# Patient Record
Sex: Female | Born: 1960 | Race: White | Hispanic: No | Marital: Married
Health system: Southern US, Community
[De-identification: ages and names within clinical notes are randomized; demographics above are authoritative.]

---

## 2020-11-27 ENCOUNTER — Encounter (HOSPITAL_COMMUNITY): Payer: Self-pay | Admitting: Emergency Medicine

## 2020-11-27 ENCOUNTER — Emergency Department (HOSPITAL_COMMUNITY): Payer: Medicaid - Out of State

## 2020-11-27 ENCOUNTER — Emergency Department (HOSPITAL_COMMUNITY)
Admission: EM | Admit: 2020-11-27 | Discharge: 2020-11-28 | Discharge status: Against Medical Advice | Payer: Medicaid - Out of State | Attending: Emergency Medicine | Admitting: Emergency Medicine

## 2020-11-27 DIAGNOSIS — H4710 Unspecified papilledema: Secondary | ICD-10-CM

## 2020-11-27 DIAGNOSIS — H539 Unspecified visual disturbance: Secondary | ICD-10-CM

## 2020-11-27 DIAGNOSIS — R519 Headache, unspecified: Secondary | ICD-10-CM | POA: Insufficient documentation

## 2020-11-27 DIAGNOSIS — H538 Other visual disturbances: Secondary | ICD-10-CM | POA: Diagnosis present

## 2020-11-27 LAB — CBC WITH DIFFERENTIAL/PLATELET
Abs Immature Granulocytes: 0.05 10*3/uL (ref 0.00–0.07)
Basophils Absolute: 0.1 10*3/uL (ref 0.0–0.1)
Basophils Relative: 1 %
Eosinophils Absolute: 0.2 10*3/uL (ref 0.0–0.5)
Eosinophils Relative: 2 %
HCT: 41.3 % (ref 36.0–46.0)
Hemoglobin: 13.1 g/dL (ref 12.0–15.0)
Immature Granulocytes: 1 %
Lymphocytes Relative: 25 %
Lymphs Abs: 2.7 10*3/uL (ref 0.7–4.0)
MCH: 28.3 pg (ref 26.0–34.0)
MCHC: 31.7 g/dL (ref 30.0–36.0)
MCV: 89.2 fL (ref 80.0–100.0)
Monocytes Absolute: 0.8 10*3/uL (ref 0.1–1.0)
Monocytes Relative: 7 %
Neutro Abs: 7.2 10*3/uL (ref 1.7–7.7)
Neutrophils Relative %: 64 %
Platelets: 334 10*3/uL (ref 150–400)
RBC: 4.63 MIL/uL (ref 3.87–5.11)
RDW: 14.7 % (ref 11.5–15.5)
WBC: 11 10*3/uL — ABNORMAL HIGH (ref 4.0–10.5)
nRBC: 0 % (ref 0.0–0.2)

## 2020-11-27 LAB — COMPREHENSIVE METABOLIC PANEL
ALT: 30 U/L (ref 0–44)
AST: 33 U/L (ref 15–41)
Albumin: 3.7 g/dL (ref 3.5–5.0)
Alkaline Phosphatase: 100 U/L (ref 38–126)
Anion gap: 7 (ref 5–15)
BUN: 9 mg/dL (ref 6–20)
CO2: 27 mmol/L (ref 22–32)
Calcium: 9 mg/dL (ref 8.9–10.3)
Chloride: 105 mmol/L (ref 98–111)
Creatinine, Ser: 0.68 mg/dL (ref 0.44–1.00)
GFR, Estimated: >60 mL/min (ref >60)
Glucose, Bld: 122 mg/dL — ABNORMAL HIGH (ref 70–99)
Potassium: 3.9 mmol/L (ref 3.5–5.1)
Sodium: 139 mmol/L (ref 135–145)
Total Bilirubin: 0.4 mg/dL (ref 0.3–1.2)
Total Protein: 6.5 g/dL (ref 6.5–8.1)

## 2020-11-27 IMAGING — MR MR HEAD WO/W CM
7 of 13 series · 23 of 48 positions shown · IV contrast (gadavist)
Comparison: No prior MRI, correlation is needed with CT head
[DATE].

CLINICAL DATA: Binocular vision loss.

EXAM:
MRI HEAD AND ORBITS WITHOUT AND WITH CONTRAST
TECHNIQUE: Multiplanar, multiecho pulse sequences of the brain and surrounding
structures were obtained without and with intravenous contrast.
Multiplanar, multiecho pulse sequences of the orbits and surrounding
structures were obtained including fat saturation techniques, before
and after intravenous contrast administration.
CONTRAST:  9.5mL GADAVIST GADOBUTROL 1 MMOL/ML IV SOLN

[Series 2: DWI · axial · 3.0mm · 0.94mm/px · z∈[-87,+69]mm · 8 of 108 slices shown (1 of 2)]
[im 1/108]
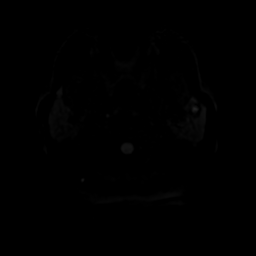
[im 16/108]
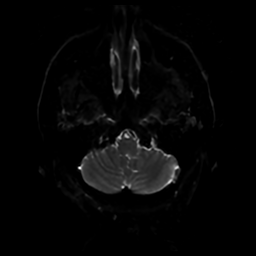
[im 31/108]
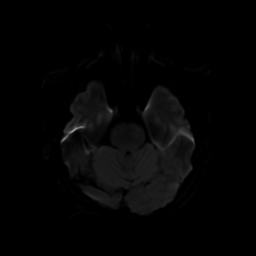
[im 46/108]
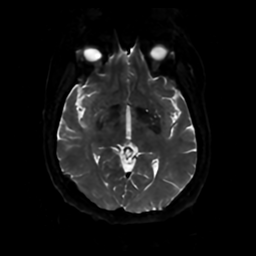
[im 62/108]
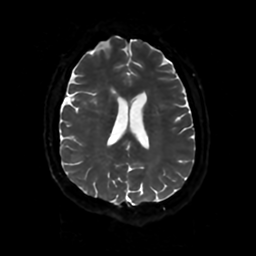
[im 77/108]
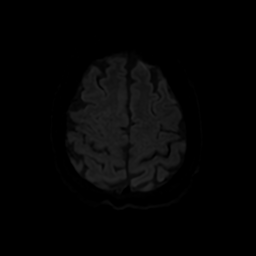
[im 92/108]
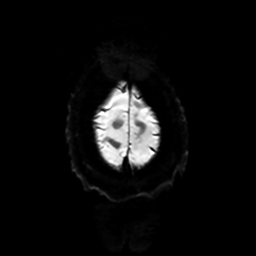
[im 108/108]
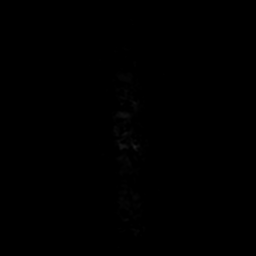

[Series 3: DWI · coronal · 4.0mm · 0.94mm/px · 5 of 74 slices shown (2 of 2)]
[im 1/74]
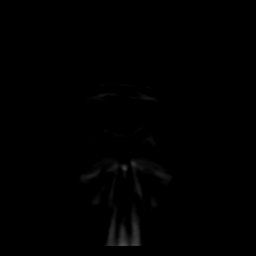
[im 19/74]
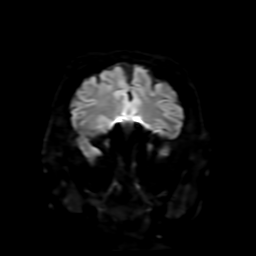
[im 37/74]
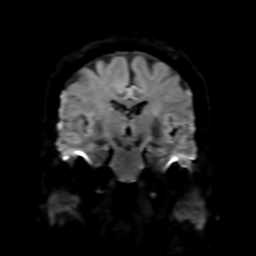
[im 55/74]
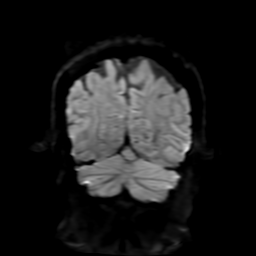
[im 74/74]
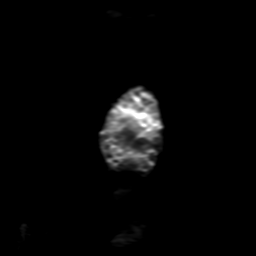

[Series 4: FLAIR · sagittal · 5.0mm · 0.23mm/px · 2 of 28 slices shown (1 of 2)]
[im 1/28]
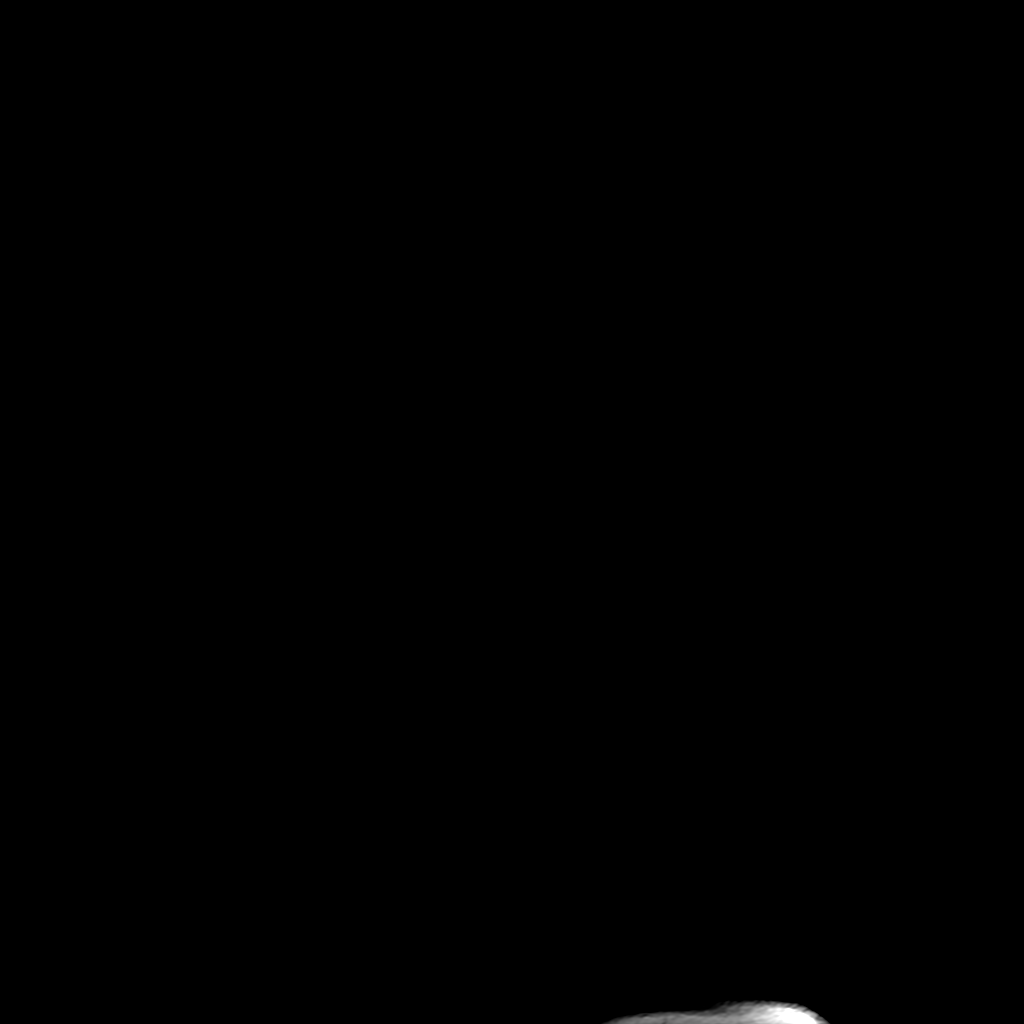
[im 28/28]
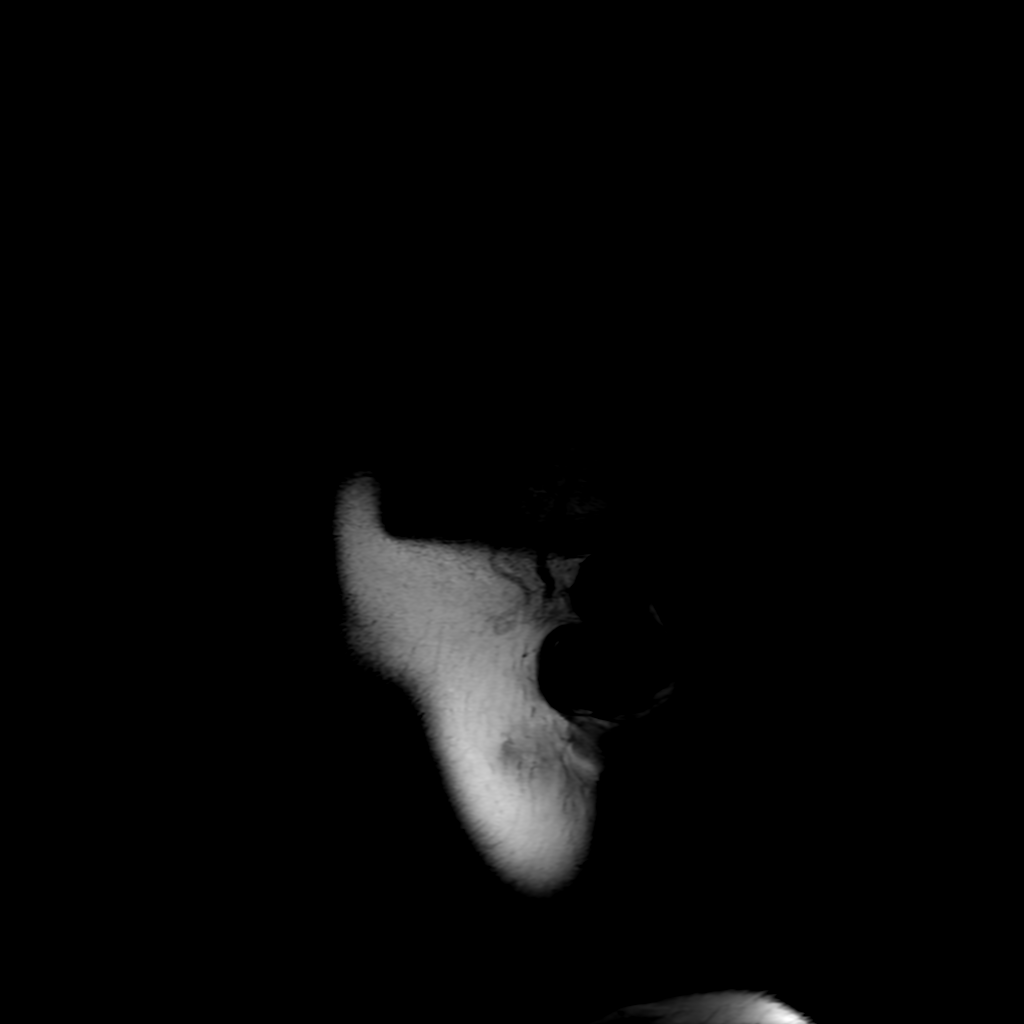

[Series 5: T2 · axial · 5.0mm · 0.23mm/px · 1 of 27 slices shown]
[im 1/27]
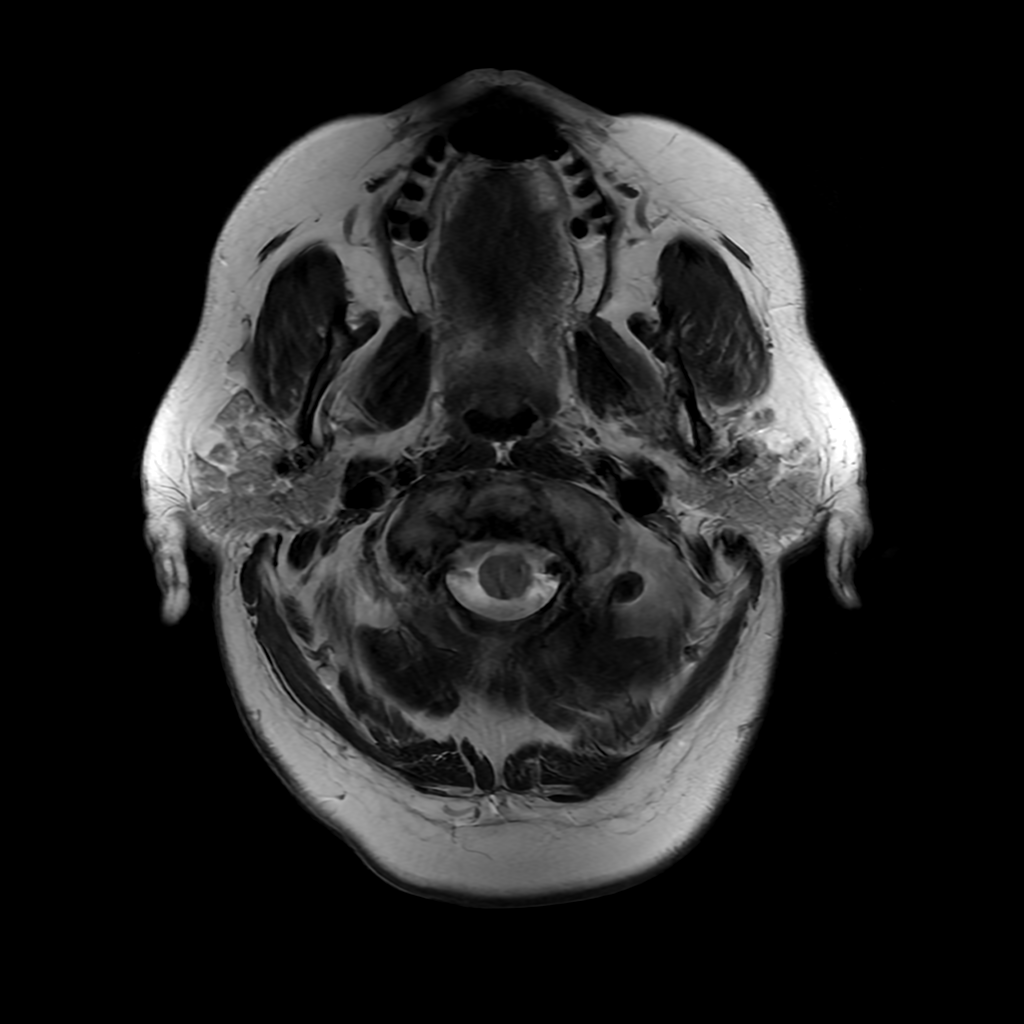

[Series 6: FLAIR · axial · 4.0mm · 0.45mm/px · z∈[-85,+65]mm · 2 of 36 slices shown (2 of 2)]
[im 1/36]
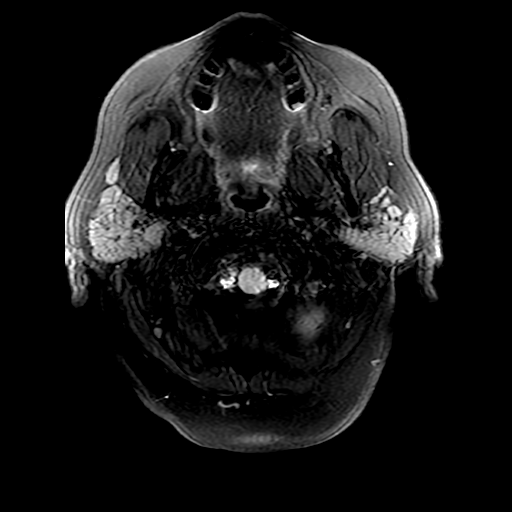
[im 36/36]
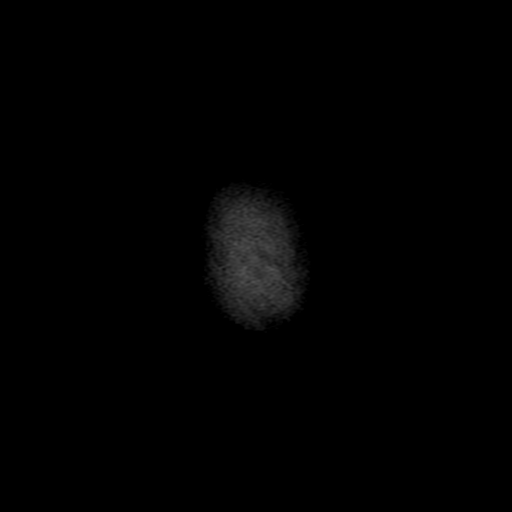

[Series 250: ADC · axial · 3.0mm · 0.94mm/px · z∈[-87,+69]mm · 3 of 54 slices shown (1 of 2)]
[im 1/54]
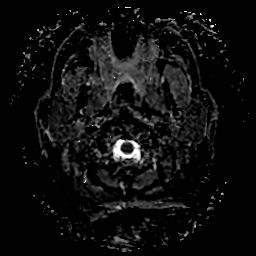
[im 27/54]
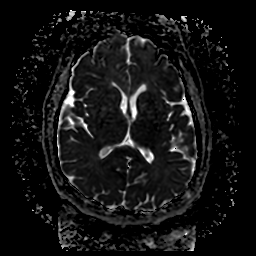
[im 54/54]
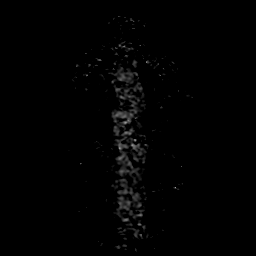

[Series 350: ADC · coronal · 4.0mm · 0.94mm/px · 2 of 37 slices shown (2 of 2)]
[im 1/37]
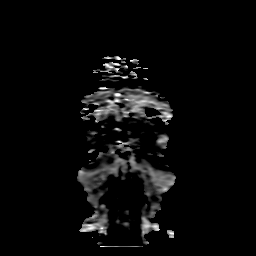
[im 37/37]
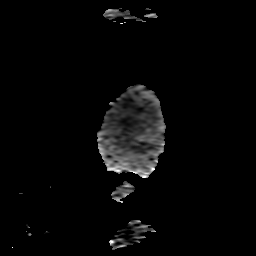

[23 of 48 positions shown; findings below may reference images not displayed]

FINDINGS: MRI HEAD FINDINGS

Brain: No acute infarction, hemorrhage, hydrocephalus, extra-axial
collection or mass lesion. Partial empty sella. Scattered T2
hyperintense signal in the periventricular white matter, likely the
sequela of chronic small vessel ischemic disease. No cerebellar
tonsillar ectopia.

Vascular: Normal arterial flow voids. Narrowing of the bilateral
transverse sinuses at the transverse sigmoid junction.

Skull and upper cervical spine: Normal marrow signal.

Other: None.

MRI ORBITS FINDINGS

Orbits: No traumatic or inflammatory finding. Globes, orbital fat,
extraocular muscles, vascular structures, and lacrimal glands are
normal. Tortuous optic nerves, with prominence of the subarachnoid
space around the optic nerves. Mild flattening of the posterior
sclera, with possible protrusion of the left greater than right
optic nerve head. No definite enhancement of the optic nerves,
although evaluation of the distal optic nerves on postcontrast
imaging is somewhat limited by motion artifact.

Visualized sinuses: Mild mucosal thickening in the ethmoid air
cells. The mastoids are well aerated.

Soft tissues: Negative.
IMPRESSION: 1. Optic nerve tortuosity, prominence of the subarachnoid space
around the optic nerves, flattening of the posterior sclera, and
mild protrusion of the optic nerve heads, consistent with the
patient's reported papilledema. In addition to narrowing of the
bilateral transverse sinuses at the transverse sigmoid junctions and
partially empty sella, these findings are concerning for idiopathic
intracranial hypertension. Correlate with opening pressure.
2. No other orbital or intracranial abnormality. No definite
abnormal optic nerve enhancement, although evaluation of the distal
optic nerves is limited by motion artifact.

## 2020-11-27 IMAGING — CT CT ANGIO HEAD
2 of 7 series · 8 of 33 positions shown · IV contrast (APPLIED)
Comparison: Prior head CT from earlier the same day.

CLINICAL DATA: Initial evaluation for neuro deficit, stroke
suspected, vision loss.

EXAM:
CT ANGIOGRAPHY HEAD AND NECK
TECHNIQUE: Multidetector CT imaging of the head and neck was performed using
the standard protocol during bolus administration of intravenous
contrast. Multiplanar CT image reconstructions and MIPs were
obtained to evaluate the vascular anatomy. Carotid stenosis
measurements (when applicable) are obtained utilizing NASCET
criteria, using the distal internal carotid diameter as the
denominator.
CONTRAST:  75mL OMNIPAQUE IOHEXOL 350 MG/ML SOLN

[Series 5: cta neck/head · axial · 0.51mm/px · z∈[+944,+1056]mm · 2 of 170 slices shown]
[im 57/170  soft-tissue]
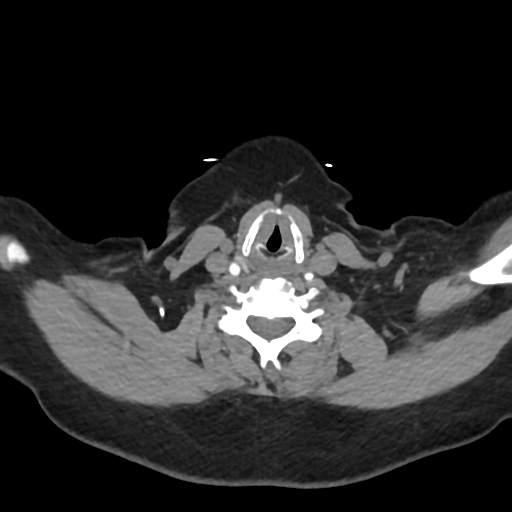
[im 113/170  soft-tissue]
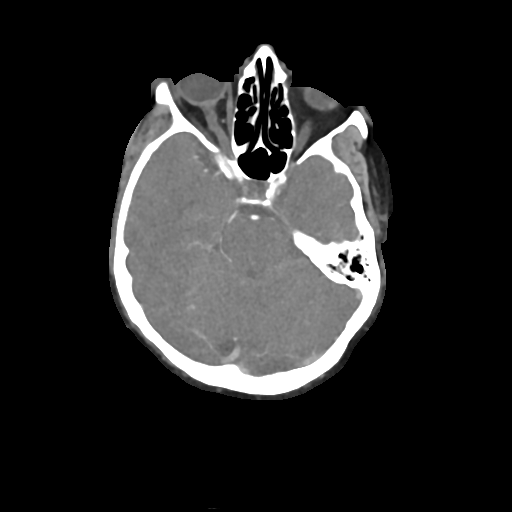

[Series 7: ax thins · axial · 0.42mm/px · z∈[+882,+1122]mm · 6 of 338 slices shown]
[im 49/338  soft-tissue]
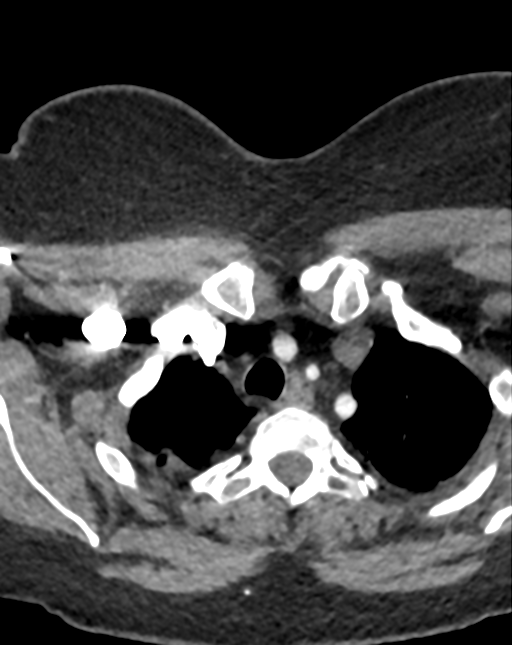
[im 97/338  bone]
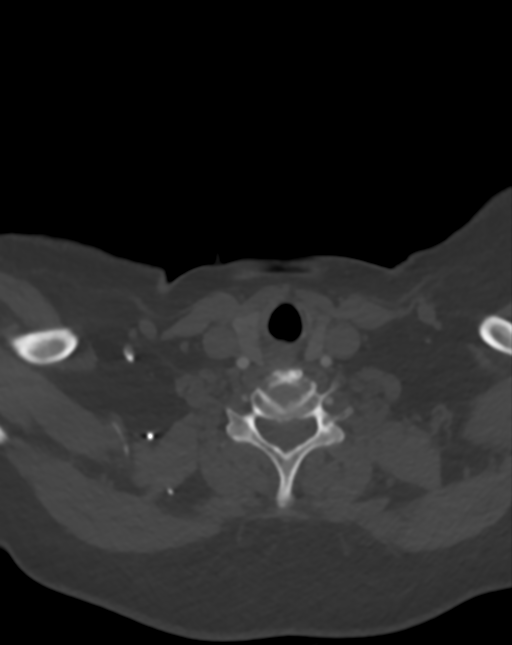
[im 145/338  soft-tissue]
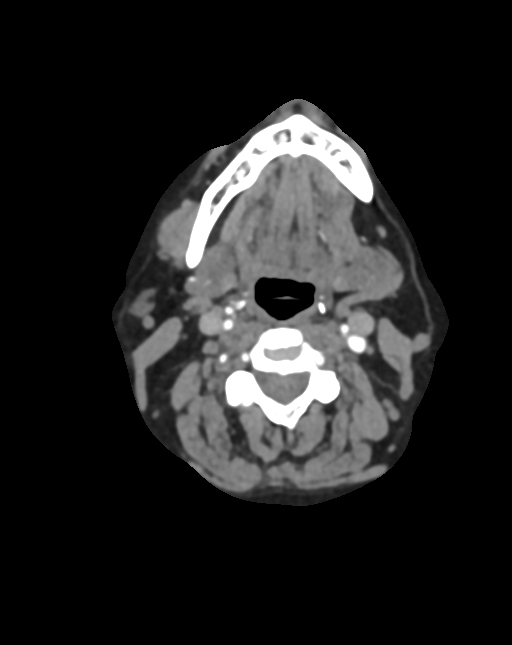
[im 193/338  bone]
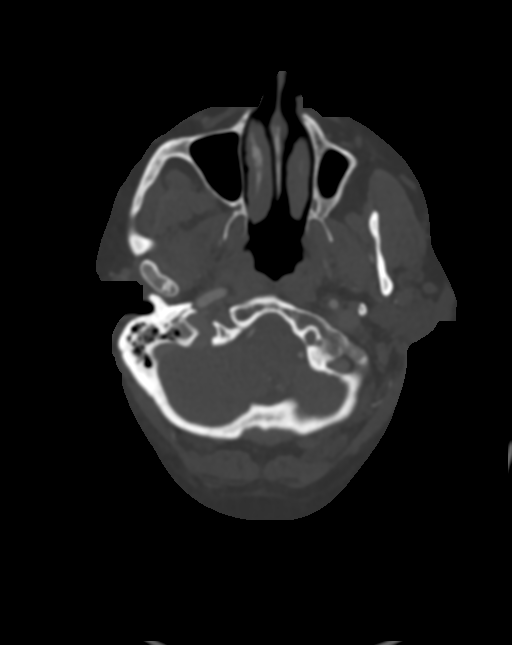
[im 241/338  soft-tissue]
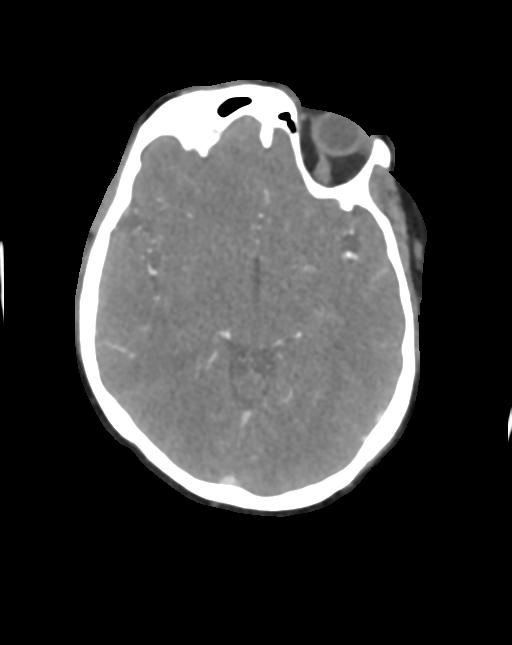
[im 289/338  bone]
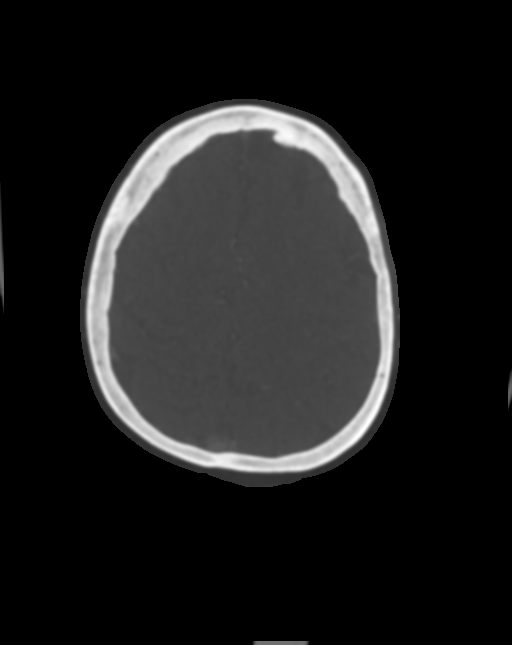

[8 of 33 positions shown; findings below may reference images not displayed]

FINDINGS: CTA NECK FINDINGS

Aortic arch: Visualized aortic arch normal in caliber with normal 3
vessel morphology. Mild atheromatous change about the arch itself.
No hemodynamically significant stenosis seen about the origin of the
great vessels.

Right carotid system: Right CCA patent from its origin to the
bifurcation without stenosis. Mild atheromatous plaque about the
right carotid bulb/proximal right ICA without significant stenosis.
Right ICA patent distally without stenosis dissection or occlusion.

Left carotid system: Left CCA patent from its origin to the
bifurcation without stenosis. Mild plaque about the left carotid
bulb/proximal left ICA without significant stenosis. Left ICA patent
distally without stenosis, dissection or occlusion.

Vertebral arteries: Both vertebral arteries arise from the
subclavian arteries. No proximal subclavian artery stenosis. Left
vertebral artery slightly dominant. Vertebral arteries patent
without stenosis dissection or occlusion.

Skeleton: No visible acute osseous finding. No discrete or worrisome
osseous lesions.

Other neck: No other acute soft tissue abnormality. No mass or
adenopathy.

Upper chest: No other visible acute abnormality within the upper
chest. Centrilobular and paraseptal emphysematous changes noted.

Review of the MIP images confirms the above findings

CTA HEAD FINDINGS

Anterior circulation: Petrous segments patent bilaterally. Mild
atheromatous change within the carotid siphons with no more than
mild multifocal narrowing. A1 segments patent bilaterally. Normal
anterior communicating artery complex. Anterior cerebral arteries
patent to their distal aspects without stenosis. No M1 stenosis or
occlusion. Normal MCA bifurcations. Distal MCA branches well
perfused and symmetric.

Posterior circulation: Both V4 segments patent to the
vertebrobasilar junction without stenosis. Both PICA origins patent
and normal. Basilar patent to its distal aspect without stenosis.
Superior cerebral arteries patent proximally. Both PCAs primarily
supplied via the basilar well perfused to their distal aspects.

Venous sinuses: Grossly patent allowing for timing the contrast
bolus.

Anatomic variants: None significant.  No intracranial aneurysm.

Review of the MIP images confirms the above findings
IMPRESSION: 1. Negative CTA for large vessel occlusion. No other acute vascular
abnormality.
2. Mild atherosclerotic change about the carotid bifurcations and
carotid siphons without hemodynamically significant or correctable
stenosis.
3. Emphysema ([LH]-[LH]).

## 2020-11-27 IMAGING — CT CT ANGIO NECK
2 of 7 series · 8 of 33 positions shown · IV contrast (APPLIED)
Comparison: Prior head CT from earlier the same day.

CLINICAL DATA: Initial evaluation for neuro deficit, stroke
suspected, vision loss.

EXAM:
CT ANGIOGRAPHY HEAD AND NECK
TECHNIQUE: Multidetector CT imaging of the head and neck was performed using
the standard protocol during bolus administration of intravenous
contrast. Multiplanar CT image reconstructions and MIPs were
obtained to evaluate the vascular anatomy. Carotid stenosis
measurements (when applicable) are obtained utilizing NASCET
criteria, using the distal internal carotid diameter as the
denominator.
CONTRAST:  75mL OMNIPAQUE IOHEXOL 350 MG/ML SOLN

[Series 5: cta neck/head · axial · 0.51mm/px · z∈[+944,+1056]mm · 2 of 170 slices shown]
[im 57/170  soft-tissue]
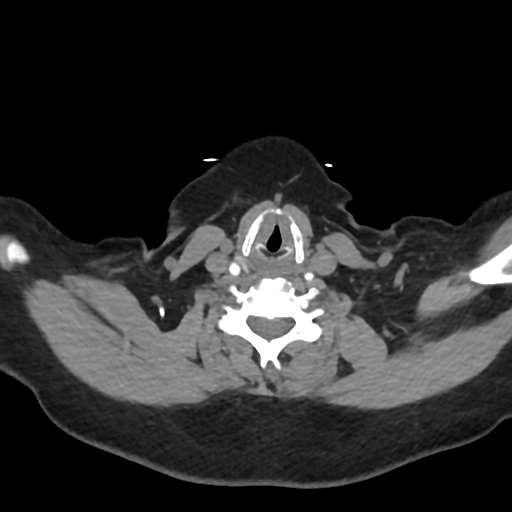
[im 113/170  soft-tissue]
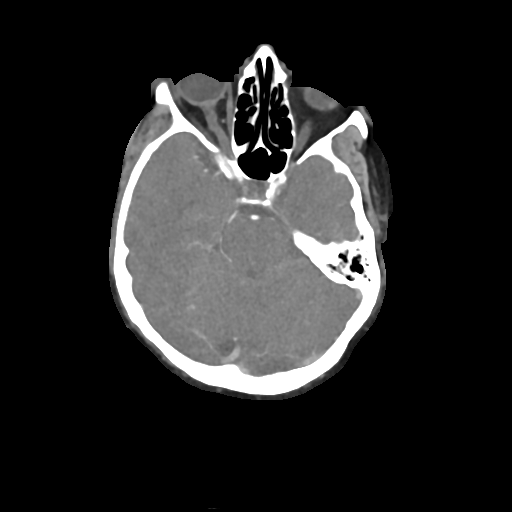

[Series 7: ax thins · axial · 0.42mm/px · z∈[+882,+1122]mm · 6 of 338 slices shown]
[im 49/338  soft-tissue]
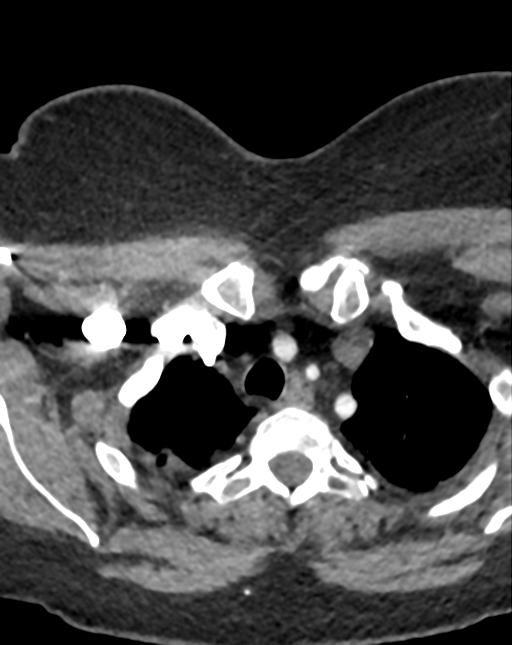
[im 97/338  bone]
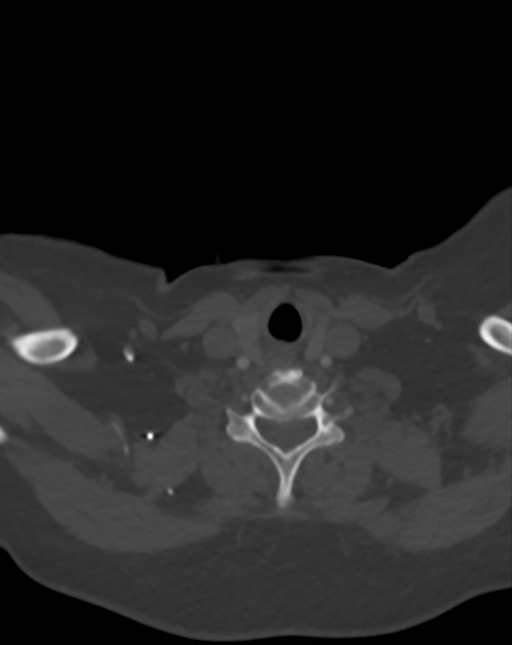
[im 145/338  soft-tissue]
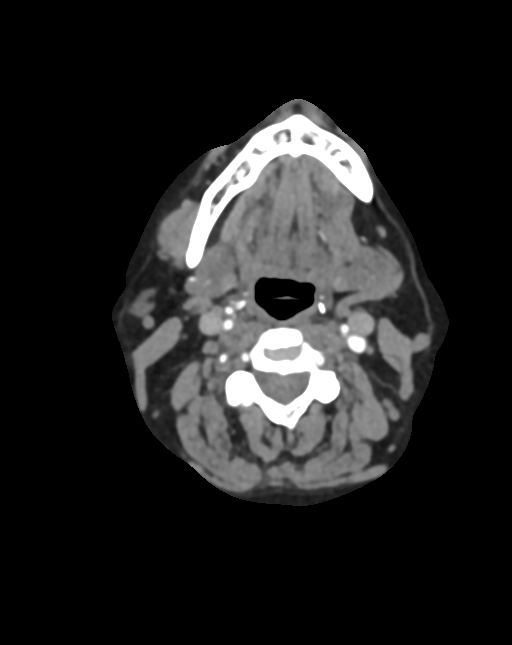
[im 193/338  bone]
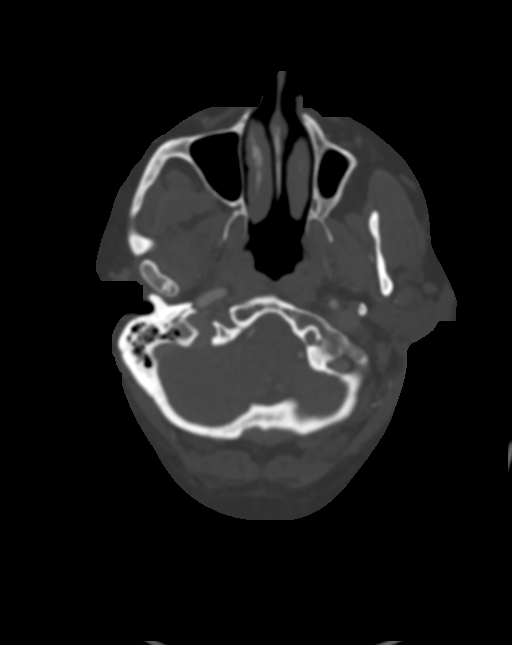
[im 241/338  soft-tissue]
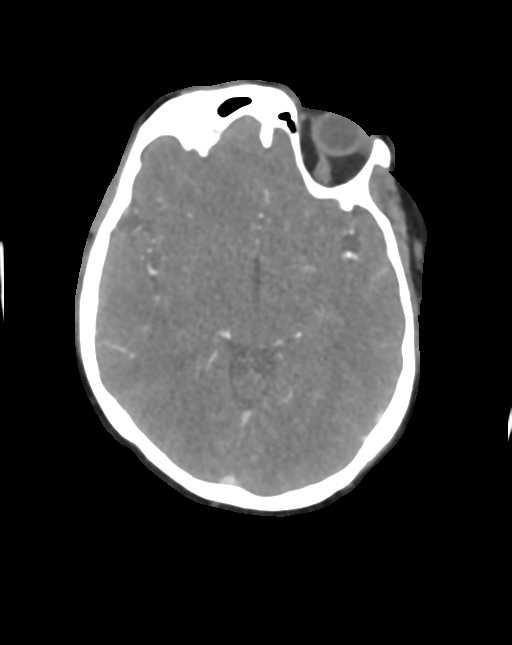
[im 289/338  bone]
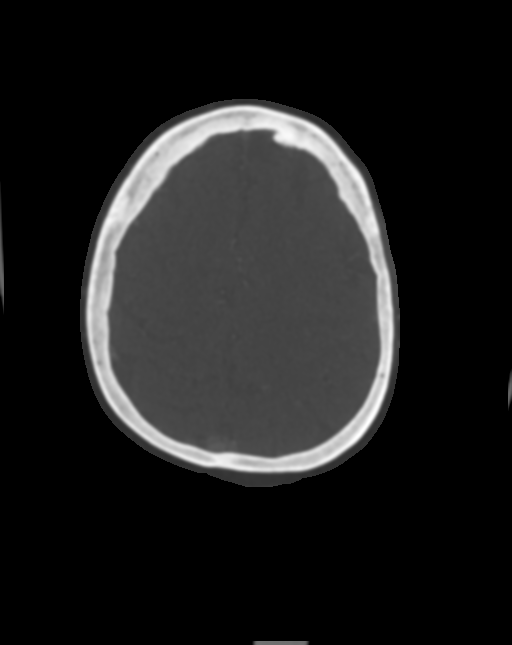

[8 of 33 positions shown; findings below may reference images not displayed]

FINDINGS: CTA NECK FINDINGS

Aortic arch: Visualized aortic arch normal in caliber with normal 3
vessel morphology. Mild atheromatous change about the arch itself.
No hemodynamically significant stenosis seen about the origin of the
great vessels.

Right carotid system: Right CCA patent from its origin to the
bifurcation without stenosis. Mild atheromatous plaque about the
right carotid bulb/proximal right ICA without significant stenosis.
Right ICA patent distally without stenosis dissection or occlusion.

Left carotid system: Left CCA patent from its origin to the
bifurcation without stenosis. Mild plaque about the left carotid
bulb/proximal left ICA without significant stenosis. Left ICA patent
distally without stenosis, dissection or occlusion.

Vertebral arteries: Both vertebral arteries arise from the
subclavian arteries. No proximal subclavian artery stenosis. Left
vertebral artery slightly dominant. Vertebral arteries patent
without stenosis dissection or occlusion.

Skeleton: No visible acute osseous finding. No discrete or worrisome
osseous lesions.

Other neck: No other acute soft tissue abnormality. No mass or
adenopathy.

Upper chest: No other visible acute abnormality within the upper
chest. Centrilobular and paraseptal emphysematous changes noted.

Review of the MIP images confirms the above findings

CTA HEAD FINDINGS

Anterior circulation: Petrous segments patent bilaterally. Mild
atheromatous change within the carotid siphons with no more than
mild multifocal narrowing. A1 segments patent bilaterally. Normal
anterior communicating artery complex. Anterior cerebral arteries
patent to their distal aspects without stenosis. No M1 stenosis or
occlusion. Normal MCA bifurcations. Distal MCA branches well
perfused and symmetric.

Posterior circulation: Both V4 segments patent to the
vertebrobasilar junction without stenosis. Both PICA origins patent
and normal. Basilar patent to its distal aspect without stenosis.
Superior cerebral arteries patent proximally. Both PCAs primarily
supplied via the basilar well perfused to their distal aspects.

Venous sinuses: Grossly patent allowing for timing the contrast
bolus.

Anatomic variants: None significant.  No intracranial aneurysm.

Review of the MIP images confirms the above findings
IMPRESSION: 1. Negative CTA for large vessel occlusion. No other acute vascular
abnormality.
2. Mild atherosclerotic change about the carotid bifurcations and
carotid siphons without hemodynamically significant or correctable
stenosis.
3. Emphysema ([LH]-[LH]).

## 2020-11-27 IMAGING — MR MR ORBITS WO/W CM
4 of 6 series · 18 of 48 positions shown · IV contrast (gadavist)
Comparison: No prior MRI, correlation is needed with CT head
[DATE].

CLINICAL DATA: Binocular vision loss.

EXAM:
MRI HEAD AND ORBITS WITHOUT AND WITH CONTRAST
TECHNIQUE: Multiplanar, multiecho pulse sequences of the brain and surrounding
structures were obtained without and with intravenous contrast.
Multiplanar, multiecho pulse sequences of the orbits and surrounding
structures were obtained including fat saturation techniques, before
and after intravenous contrast administration.
CONTRAST:  9.5mL GADAVIST GADOBUTROL 1 MMOL/ML IV SOLN

[Series 9: T2 fat-sat · coronal · 4.0mm · 0.35mm/px · 8 of 22 slices shown (1 of 2)]
[im 1/22]
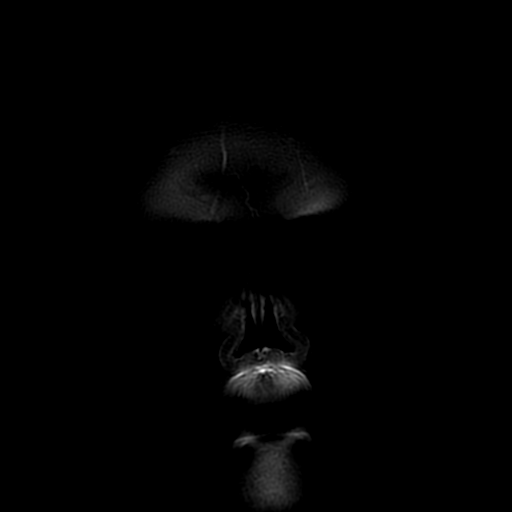
[im 4/22]
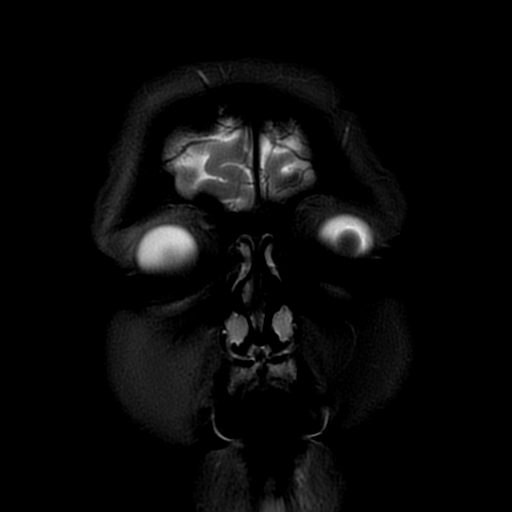
[im 7/22]
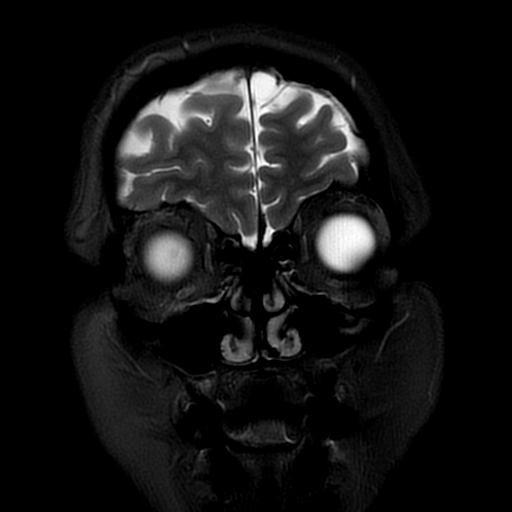
[im 10/22]
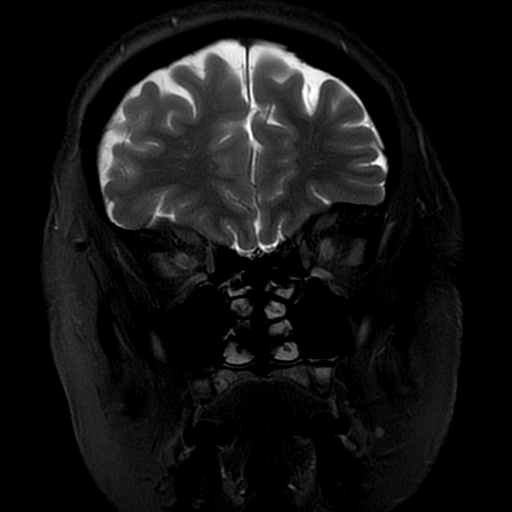
[im 13/22]
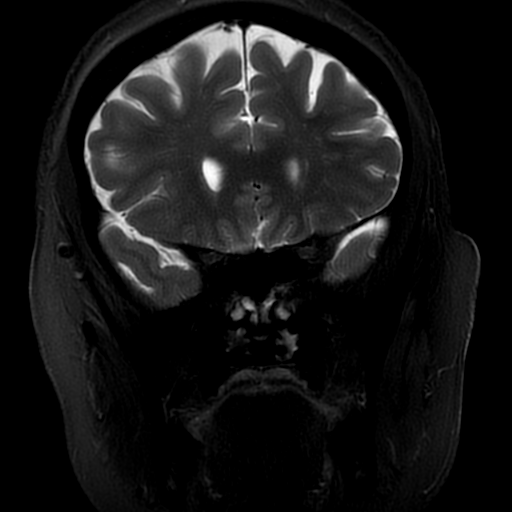
[im 16/22]
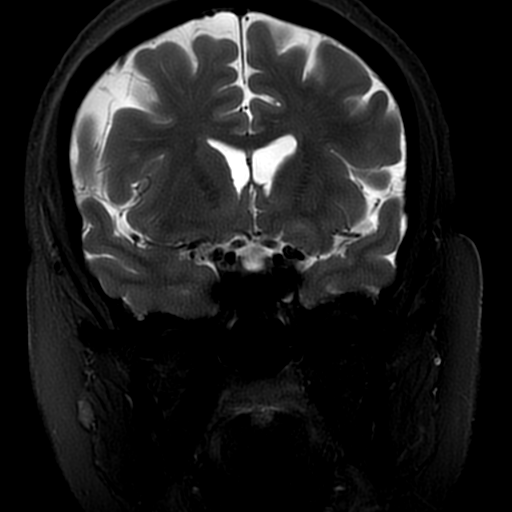
[im 19/22]
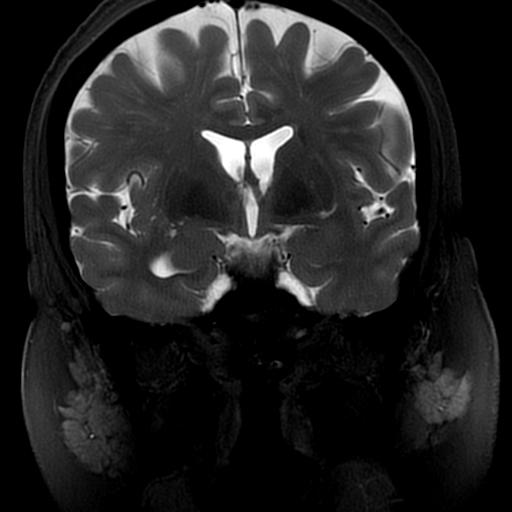
[im 22/22]
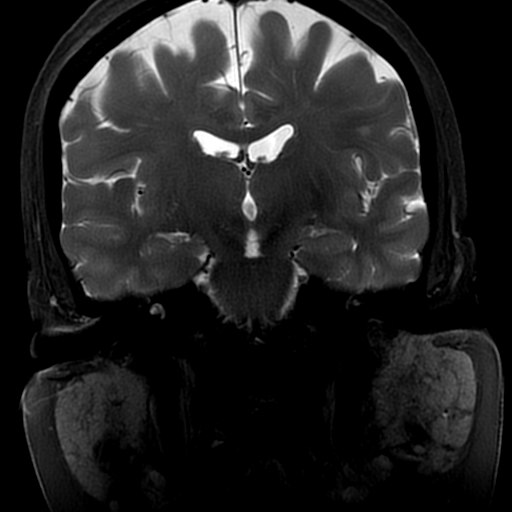

[Series 10: T2 fat-sat · axial · 3.0mm · 0.35mm/px · z∈[-54,-6]mm · 4 of 20 slices shown (2 of 2)]
[im 1/20]
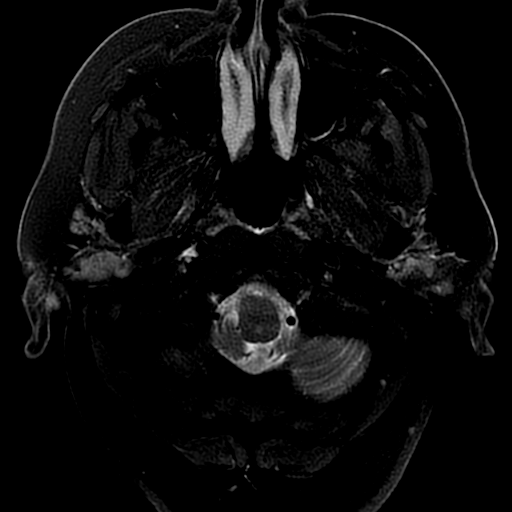
[im 3/20]
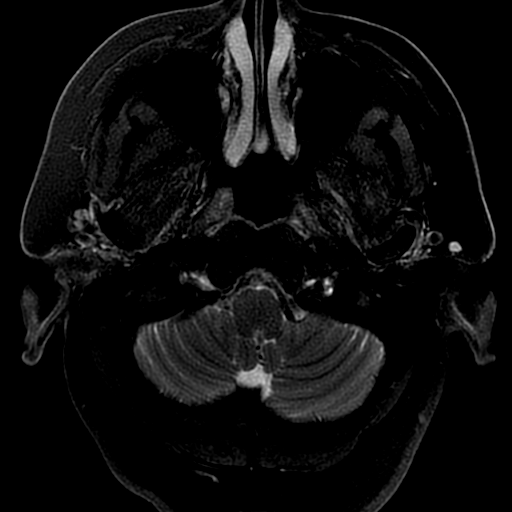
[im 11/20]
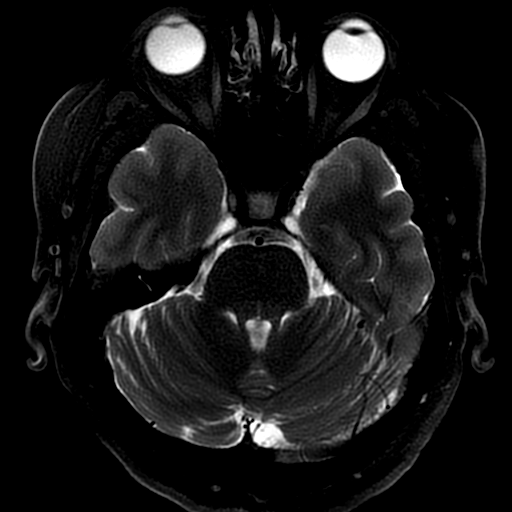
[im 17/20]
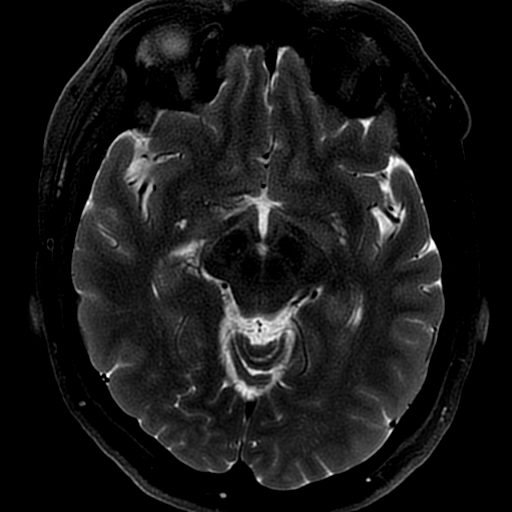

[Series 11: T1 · coronal · 4.0mm · 0.35mm/px · 3 of 22 slices shown (1 of 2)]
[im 4/22]
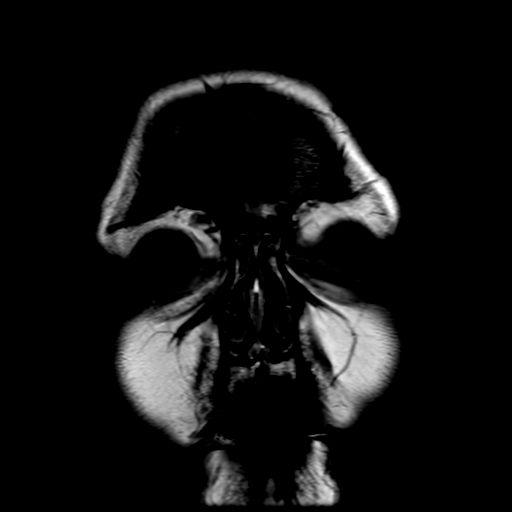
[im 13/22]
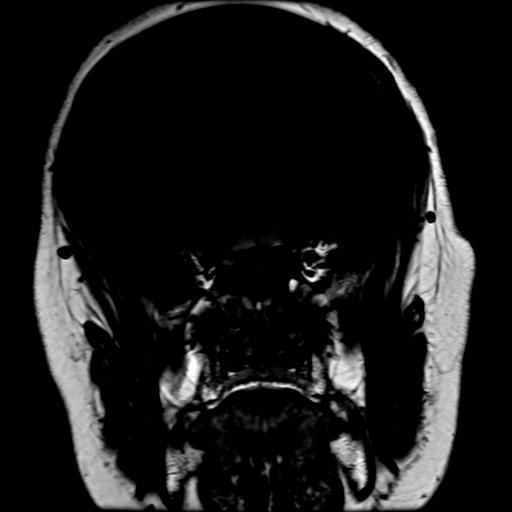
[im 19/22]
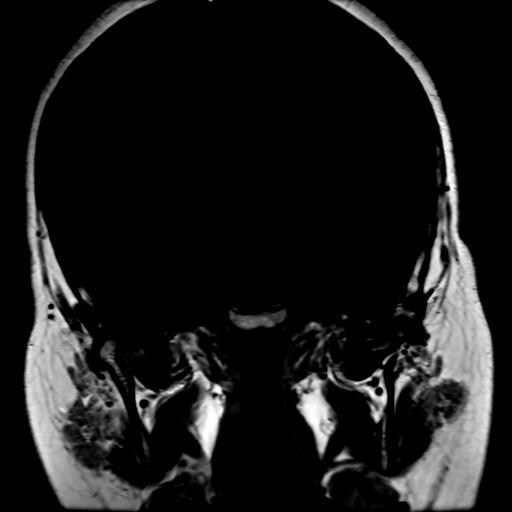

[Series 12: T1 · axial · 3.0mm · 0.35mm/px · z∈[-48,-6]mm · 3 of 20 slices shown (2 of 2)]
[im 3/20]
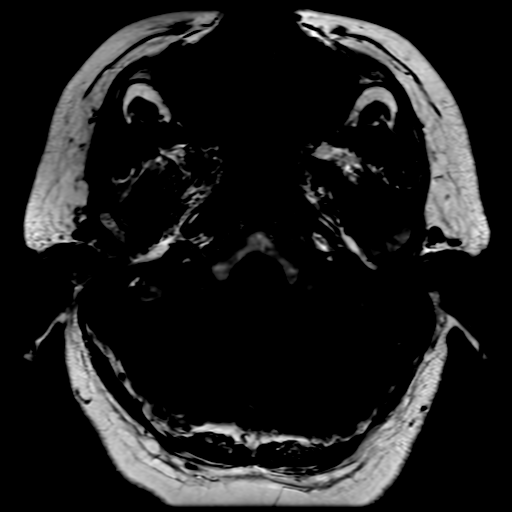
[im 11/20]
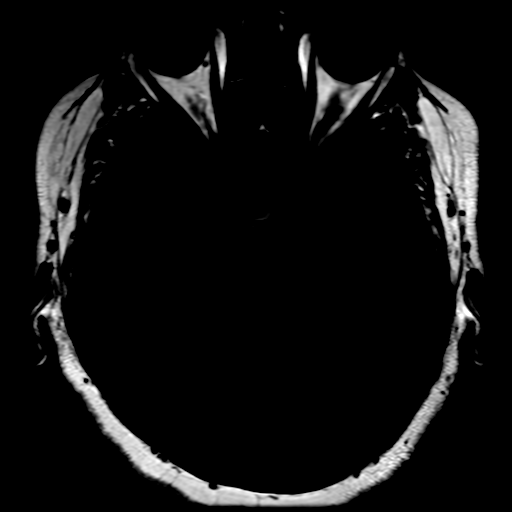
[im 17/20]
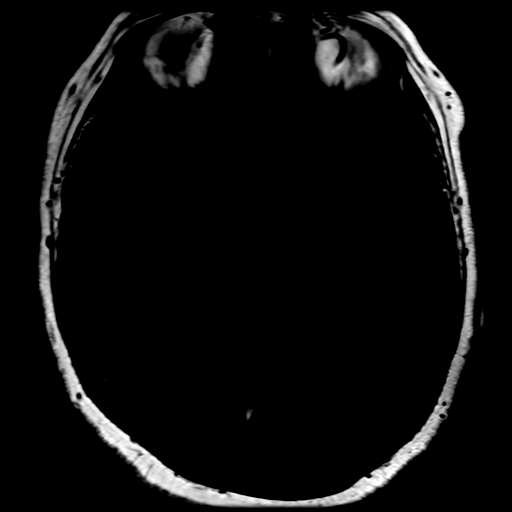

[18 of 48 positions shown; findings below may reference images not displayed]

FINDINGS: MRI HEAD FINDINGS

Brain: No acute infarction, hemorrhage, hydrocephalus, extra-axial
collection or mass lesion. Partial empty sella. Scattered T2
hyperintense signal in the periventricular white matter, likely the
sequela of chronic small vessel ischemic disease. No cerebellar
tonsillar ectopia.

Vascular: Normal arterial flow voids. Narrowing of the bilateral
transverse sinuses at the transverse sigmoid junction.

Skull and upper cervical spine: Normal marrow signal.

Other: None.

MRI ORBITS FINDINGS

Orbits: No traumatic or inflammatory finding. Globes, orbital fat,
extraocular muscles, vascular structures, and lacrimal glands are
normal. Tortuous optic nerves, with prominence of the subarachnoid
space around the optic nerves. Mild flattening of the posterior
sclera, with possible protrusion of the left greater than right
optic nerve head. No definite enhancement of the optic nerves,
although evaluation of the distal optic nerves on postcontrast
imaging is somewhat limited by motion artifact.

Visualized sinuses: Mild mucosal thickening in the ethmoid air
cells. The mastoids are well aerated.

Soft tissues: Negative.
IMPRESSION: 1. Optic nerve tortuosity, prominence of the subarachnoid space
around the optic nerves, flattening of the posterior sclera, and
mild protrusion of the optic nerve heads, consistent with the
patient's reported papilledema. In addition to narrowing of the
bilateral transverse sinuses at the transverse sigmoid junctions and
partially empty sella, these findings are concerning for idiopathic
intracranial hypertension. Correlate with opening pressure.
2. No other orbital or intracranial abnormality. No definite
abnormal optic nerve enhancement, although evaluation of the distal
optic nerves is limited by motion artifact.

## 2020-11-27 IMAGING — CT CT HEAD W/O CM
4 series · 16 of 47 positions shown, 18 images · non-contrast
Comparison: None.

CLINICAL DATA: Headache, new or worsening (Age >= 50y)

Per chart review, patient also was told she has papilledema at the
optometrist.
EXAM:
CT HEAD WITHOUT CONTRAST
TECHNIQUE: Contiguous axial images were obtained from the base of the skull
through the vertex without intravenous contrast.

[Series 3: head without · axial · non-contrast · 0.37mm/px · z∈[+1165,+1285]mm · 7 of 34 slices shown, 9 images]
[im 5/34  brain]
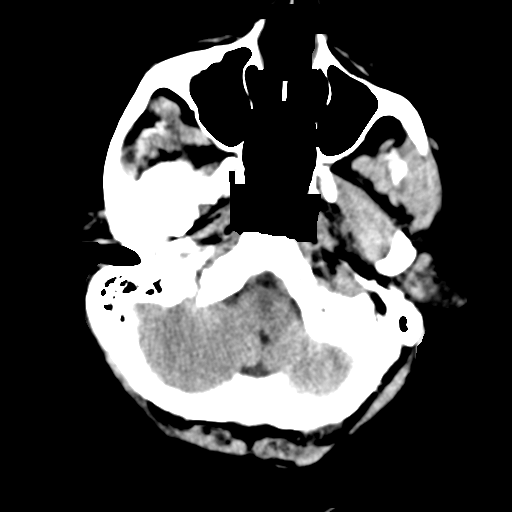
[im 5/34  bone]
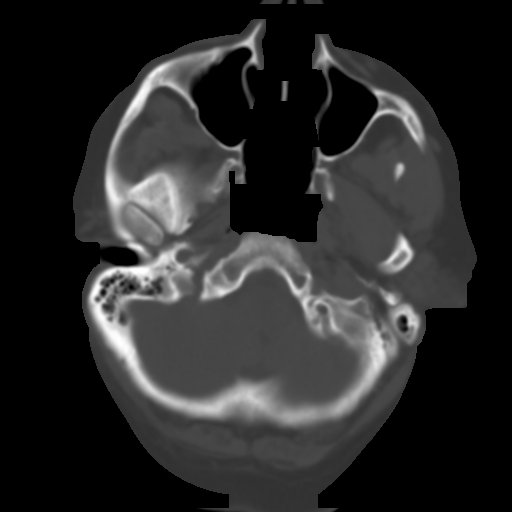
[im 9/34  brain]
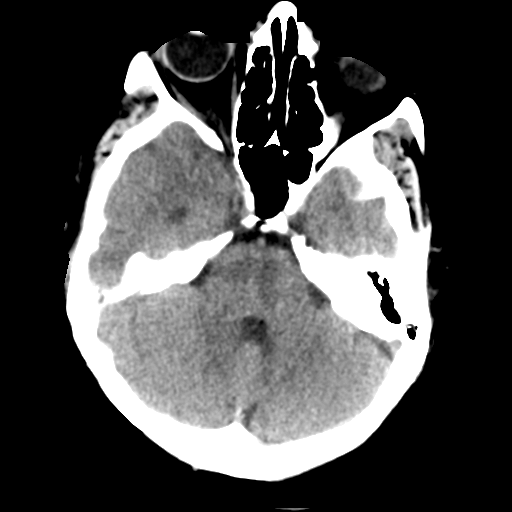
[im 13/34  brain]
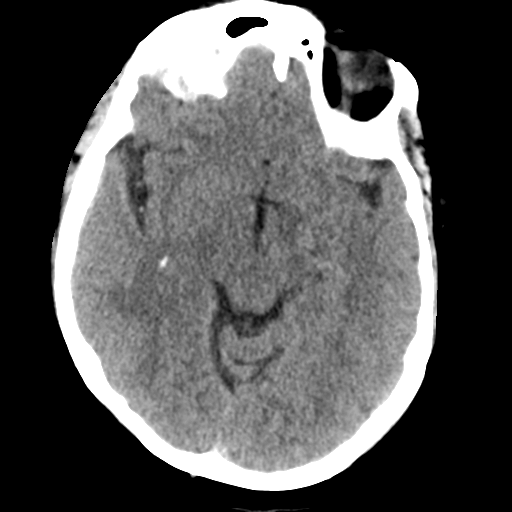
[im 17/34  brain]
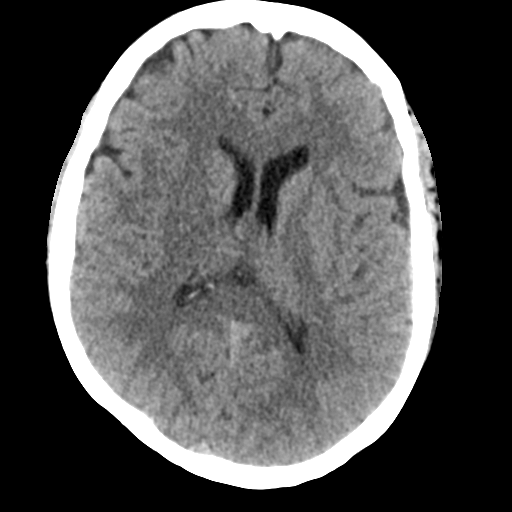
[im 21/34  brain]
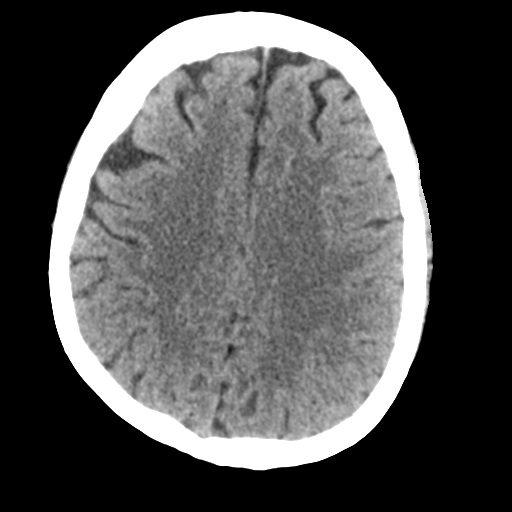
[im 21/34  bone]
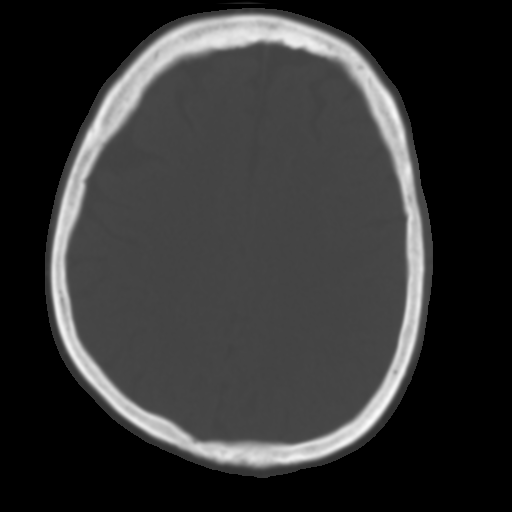
[im 25/34  brain]
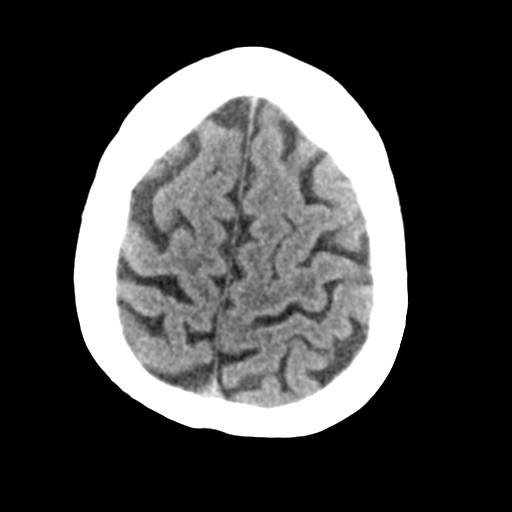
[im 29/34  brain]
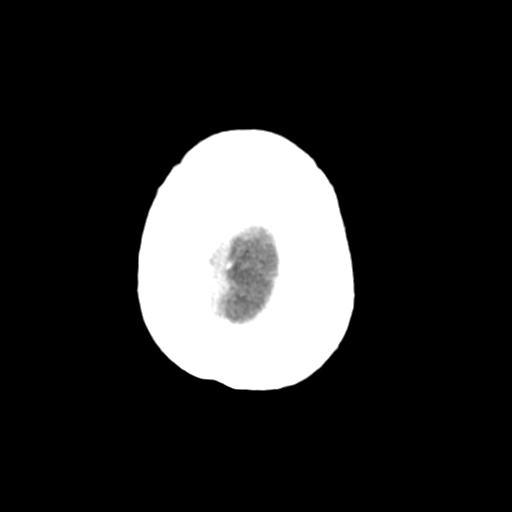

[Series 4: head bone · axial · 0.39mm/px · z∈[+1161,+1193]mm · 3 of 83 slices shown]
[im 9/83  bone]
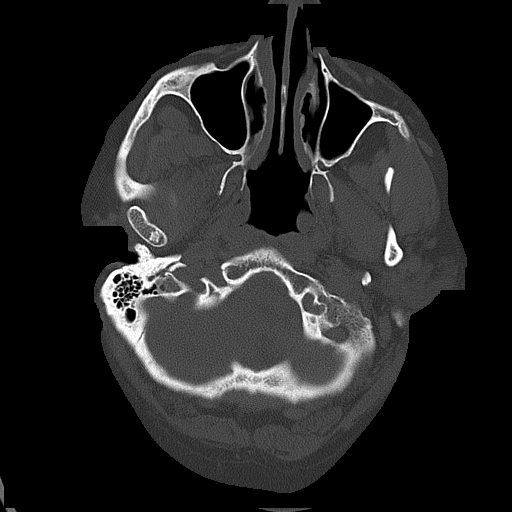
[im 17/83  bone]
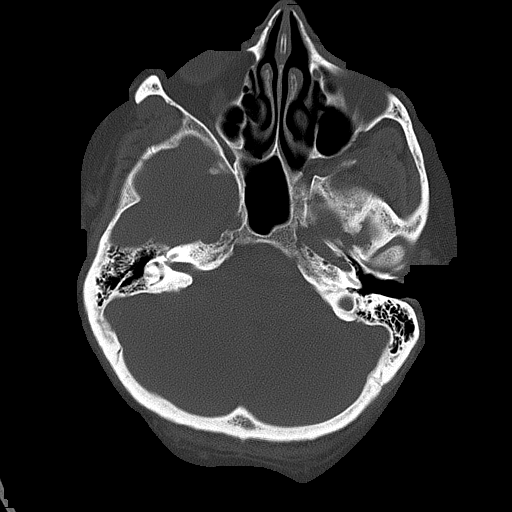
[im 25/83  bone]
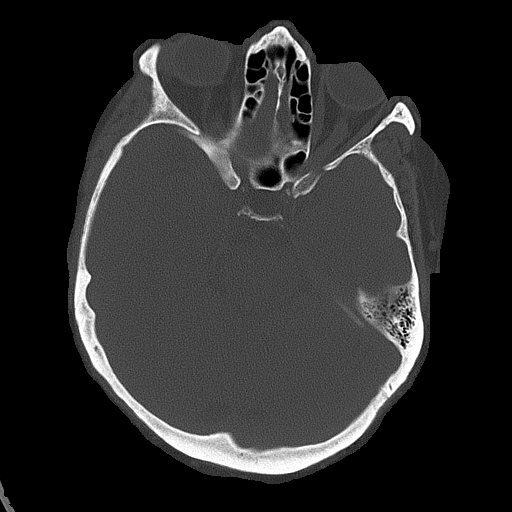

[Series 5: head without cor · coronal · non-contrast · 0.33mm/px · 3 of 67 slices shown]
[im 23/67  brain]
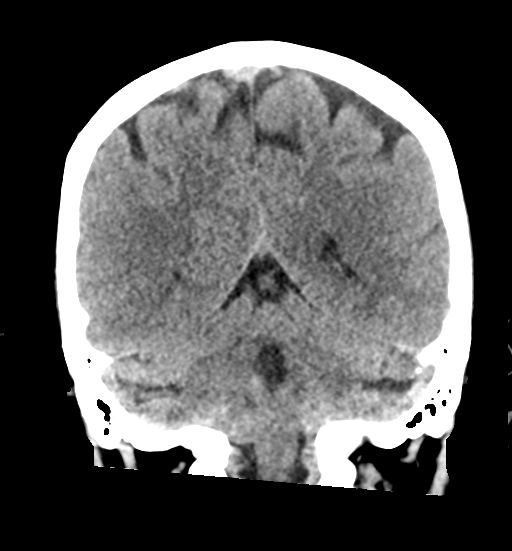
[im 30/67  brain]
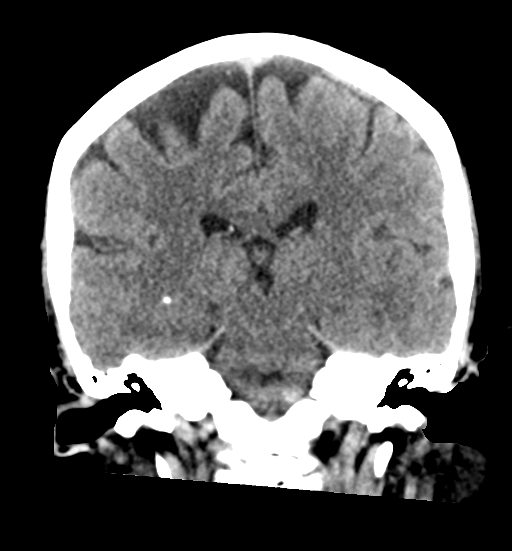
[im 37/67  brain]
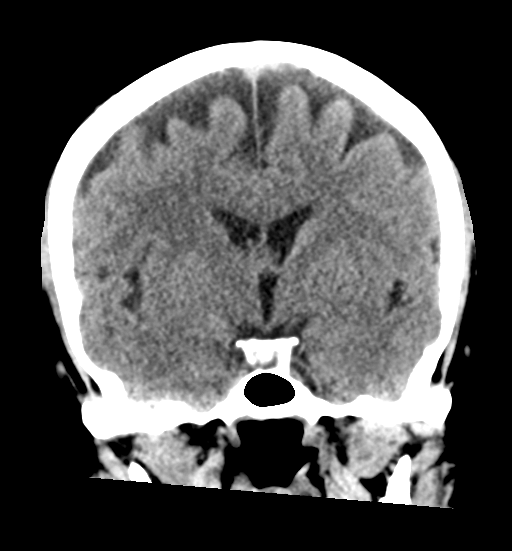

[Series 6: head without sag · sagittal · non-contrast · 0.33mm/px · 3 of 58 slices shown]
[im 20/58  brain]
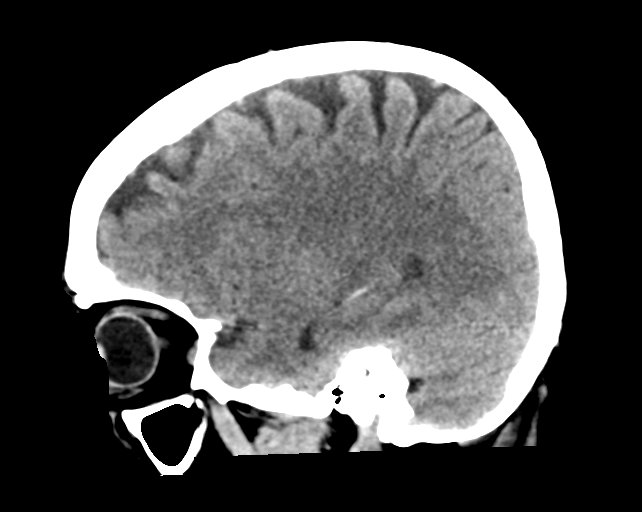
[im 29/58  brain]
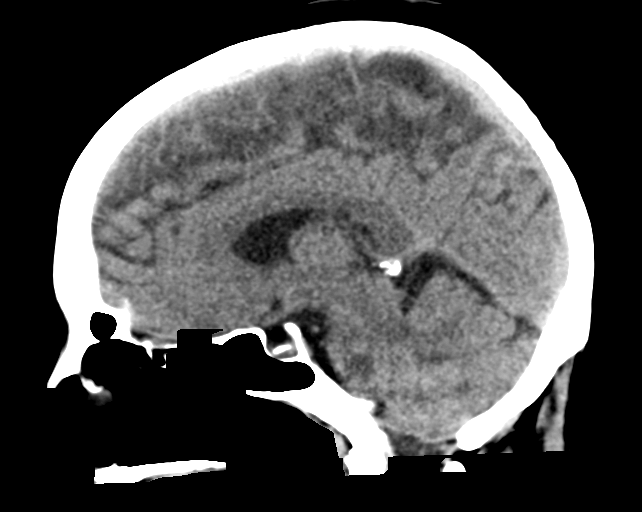
[im 39/58  brain]
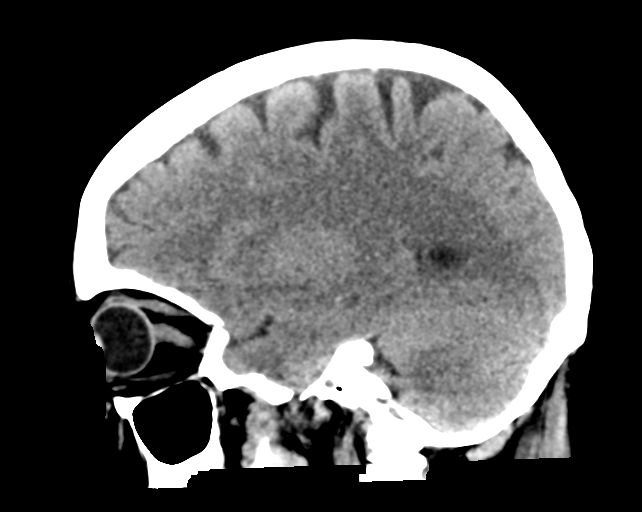

[16 of 47 positions shown; findings below may reference images not displayed]

FINDINGS: Brain: No evidence of acute infarction, hemorrhage, hydrocephalus,
extra-axial collection or mass lesion/mass effect. Partially empty
sella

Vascular: Hyperdense vessel identified.

Skull: No acute fracture.

Sinuses/Orbits: Air-fluid level in left sphenoid sinus with frothy
secretions. Mild ethmoid air cell mucosal thickening. Otherwise,
clear sinuses. Mildly tortuous optic nerves. Otherwise, unremarkable
orbits.

Other: No mastoid effusions.
IMPRESSION: 1. Partially empty sella and mildly tortuous optic nerves. These
findings are nonspecific, but can be seen with idiopathic
intracranial hypertension (which can cause headaches and
papilledema). A lumbar puncture with opening pressure could further
evaluate if clinically indicated.
2. Otherwise, no evidence of acute intracranial abnormality.
3. Air-fluid level in left sphenoid sinus with frothy secretions.

## 2020-11-27 MED ORDER — GADOBUTROL 1 MMOL/ML IV SOLN
9.5000 mL | Freq: Once | INTRAVENOUS | Status: AC | PRN
  Administered 2020-11-27: 9.5 mL via INTRAVENOUS

## 2020-11-27 MED ORDER — IOHEXOL 350 MG/ML SOLN
75.0000 mL | Freq: Once | INTRAVENOUS | Status: AC | PRN
  Administered 2020-11-27: 75 mL via INTRAVENOUS

## 2020-11-27 MED ORDER — LIDOCAINE HCL (PF) 1 % IJ SOLN
10.0000 mL | Freq: Once | INTRAMUSCULAR | Status: AC
  Administered 2020-11-28: 10 mL
  Filled 2020-11-27: qty 10

## 2020-11-27 NOTE — ED Triage Notes (Signed)
Patient complains of headache and partial loss of vision since Sunday. States she was seen at an urgent care and optometrist on Monday and prescribed a low dose of antihypertensive with no relief. Patient states headaches became more intense, vision became worse, this morning and went to optometrist and was told she has papilledema and to to go emergency department.

## 2020-11-27 NOTE — ED Provider Notes (Signed)
Emergency Medicine Provider Triage Evaluation Note  Rachael Love , a 60 y.o. female  was evaluated in triage.  Pt complains of headache.  Patient states that same began last Sunday.  Was associated with vision changes, noted that her blood pressure was high and assumed this was the cause.  Called her primary care who started her on lisinopril, this did not relieve her symptoms.  Went to an eye doctor this morning who found papilledema and sent her here.  Patient states that headache is waking her up at night, worse in the mornings and gets better throughout the day.  Review of Systems  Positive: Headache, vision changes Negative: Fevers, chills  Physical Exam  BP 130/69 (BP Location: Left Arm)   Pulse 75   Temp 98.2 F (36.8 C) (Oral)   Resp 16   Ht 5\' 4"  (1.626 m)   Wt 90.7 kg   SpO2 98%   BMI 34.33 kg/m  Gen:   Awake, no distress   Resp:  Normal effort  MSK:   Moves extremities without difficulty  Other:   Patient neurologically intact on brief neuro exam  Medical Decision Making  Medically screening exam initiated at 5:52 PM.  Appropriate orders placed.  Rachael Love was informed that the remainder of the evaluation will be completed by another provider, this initial triage assessment does not replace that evaluation, and the importance of remaining in the ED until their evaluation is complete.     Levert Feinstein 11/27/20 1755    11/29/20, DO 11/27/20 1758

## 2020-11-27 NOTE — ED Provider Notes (Signed)
[facility] EMERGENCY DEPARTMENT Provider Note   CSN: [id number] Arrival date & time: 11/27/20  1608     History Chief Complaint  Patient presents with   Eye Problem    Rachael Love is a 60 y.o. female.  Patient presents to the emergency department for evaluation of vision changes and headaches.  Patient states that her symptoms started on Sunday (today is Thursday).  She describes a dimness in her bilateral vision, potentially worse on the right side.  She states that it is in all visual fields.  She describes it as the dimness after someone flashed a light bulb in her eye --however it does not go away.  She was seen by an optometrist it was [place] to her job on Monday.  At that time she had a normal exam and was started on some blood pressure medicine.  Her headaches worsened and's and vision changes became worse and she was rechecked today.  Today she had retinal imaging which showed papilledema and a small hemorrhage on the retina.  No weakness, numbness or tingling in her arms or legs.  No previous eye surgeries.  No fever or neck pain.        History reviewed. No pertinent past medical history.  There are no problems to display for this patient.   The histories are not reviewed yet. Please review them in the "History" navigator section and refresh this SmartLink.   OB History   No obstetric history on file.     No family history on file.     Home Medications Prior to Admission medications   Not on File    Allergies    Patient has no known allergies.  Review of Systems   Review of Systems  Constitutional:  Negative for fever.  HENT:  Negative for congestion, dental problem, rhinorrhea and sinus pressure.   Eyes:  Positive for visual disturbance. Negative for photophobia, discharge and redness.  Respiratory:  Negative for shortness of breath.   Cardiovascular:  Negative for chest pain.  Gastrointestinal:  Positive for nausea and vomiting.   Musculoskeletal:  Negative for gait problem, neck pain and neck stiffness.  Skin:  Negative for rash.  Neurological:  Positive for headaches. Negative for syncope, speech difficulty, weakness, light-headedness and numbness.  Psychiatric/Behavioral:  Negative for confusion.    Physical Exam Updated Vital Signs BP 130/69 (BP Location: Left Arm)   Pulse 75   Temp 98.2 F (36.8 C) (Oral)   Resp 16   Ht 5\' 4"  (1.626 m)   Wt 90.7 kg   SpO2 98%   BMI 34.33 kg/m   Physical Exam Vitals and nursing note reviewed.  Constitutional:      Appearance: She is well-developed.  HENT:     Head: Normocephalic and atraumatic.     Right Ear: External ear normal.     Left Ear: External ear normal.     Nose: Nose normal.     Mouth/Throat:     Pharynx: Uvula midline.  Eyes:     General: Lids are normal.     Extraocular Movements:     Right eye: No nystagmus.     Left eye: No nystagmus.     Conjunctiva/sclera: Conjunctivae normal.     Pupils: Pupils are equal, round, and reactive to light.     Comments: Reactive to light. Presumed papilledema as patient arrives with retinal imaging interpreted by optometrist. Gross vision intact. She can read my badge at 2 feet and other  words in the room, she just states they are dim. Denies diplopia.   Cardiovascular:     Rate and Rhythm: Normal rate and regular rhythm.  Pulmonary:     Effort: Pulmonary effort is normal.     Breath sounds: Normal breath sounds.  Abdominal:     Palpations: Abdomen is soft.     Tenderness: There is no abdominal tenderness.  Musculoskeletal:     Cervical back: Normal range of motion and neck supple. No tenderness or bony tenderness.  Skin:    General: Skin is warm and dry.  Neurological:     General: No focal deficit present.     Mental Status: She is alert and oriented to person, place, and time.     GCS: GCS eye subscore is 4. GCS verbal subscore is 5. GCS motor subscore is 6.     Cranial Nerves: No cranial nerve  deficit.     Sensory: No sensory deficit.     Motor: No weakness.     Coordination: Coordination normal.     Gait: Gait normal.     Comments: Upper extremity myotomes tested bilaterally:  C5 Shoulder abduction 5/5 C6 Elbow flexion/wrist extension 5/5 C7 Elbow extension 5/5 C8 Finger flexion 5/5 T1 Finger abduction 5/5  Lower extremity myotomes tested bilaterally: L2 Hip flexion 5/5 L3 Knee extension 5/5 L4 Ankle dorsiflexion 5/5 S1 Ankle plantar flexion 5/5     ED Results / Procedures / Treatments   Labs (all labs ordered are listed, but only abnormal results are displayed) Labs Reviewed  CBC WITH DIFFERENTIAL/PLATELET - Abnormal; Notable for the following components:      Result Value   WBC 11.0 (*)    All other components within normal limits  COMPREHENSIVE METABOLIC PANEL - Abnormal; Notable for the following components:   Glucose, Bld 122 (*)    All other components within normal limits    EKG None  Radiology CT HEAD WO CONTRAST ( )  Result Date: 11/27/2020 CLINICAL DATA:  Headache, new or worsening (Age >= 50y) Per chart review, patient also was told she has papilledema at the optometrist. EXAM: CT HEAD WITHOUT CONTRAST TECHNIQUE: Contiguous axial images were obtained from the base of the skull through the vertex without intravenous contrast. COMPARISON:  None. FINDINGS: Brain: No evidence of acute infarction, hemorrhage, hydrocephalus, extra-axial collection or mass lesion/mass effect. Partially empty sella Vascular: Hyperdense vessel identified. Skull: No acute fracture. Sinuses/Orbits: Air-fluid level in left sphenoid sinus with frothy secretions. Mild ethmoid air cell mucosal thickening. Otherwise, clear sinuses. Mildly tortuous optic nerves. Otherwise, unremarkable orbits. Other: No mastoid effusions. IMPRESSION: 1. Partially empty sella and mildly tortuous optic nerves. These findings are nonspecific, but can be seen with idiopathic intracranial hypertension (which  can cause headaches and papilledema). A lumbar puncture with opening pressure could further evaluate if clinically indicated. 2. Otherwise, no evidence of acute intracranial abnormality. 3. Air-fluid level in left sphenoid sinus with frothy secretions. Electronically Signed   By: Feliberto Harts M.D.   On: 11/27/2020 19:11    Procedures .Lumbar Puncture  Date/Time: 11/28/2020 12:47 AM Performed by: Renne Crigler, PA-C Authorized by: Sabas Sous, MD   Consent:    Consent obtained:  Verbal and written   Consent given by:  Patient   Risks discussed:  Bleeding, infection, repeat procedure, pain, nerve damage and headache   Alternatives discussed:  No treatment Universal protocol:    Procedure explained and questions answered to patient or proxy's satisfaction: yes  Imaging studies available: yes     Patient identity confirmed:  Verbally with patient, arm band and provided demographic data Pre-procedure details:    Procedure purpose:  Diagnostic   Preparation: Patient was prepped and draped in usual sterile fashion   Sedation:    Sedation type:  None Anesthesia:    Anesthesia method:  Local infiltration   Local anesthetic:  Lidocaine 1% w/o epi Procedure details:    Lumbar space:  L4-L5 interspace   Patient position:  L lateral decubitus   Needle gauge:  20   Needle type:  Spinal needle - Quincke tip   Needle length (in):  5.0   Ultrasound guidance: no   Post-procedure details:    Puncture site:  Adhesive bandage applied   Procedure completion:  Tolerated well, no immediate complications Comments:     Attempt made to perform at bedside. Dr. Pilar Plate present. Unsuccessful attempt. Difficult procedure due to habitus. Patient tolerated procedure well otherwise.    Medications Ordered in ED Medications - No data to display  ED Course  I have reviewed the triage vital signs and the nursing notes.  Pertinent labs & imaging results that were available during my care of the  patient were reviewed by me and considered in my medical decision making (see chart for details).  Patient seen and examined. Discussed with Dr. Karene Fry. Will discuss with neurology. May need LP.   Vital signs reviewed and are as follows: BP 130/69 (BP Location: Left Arm)   Pulse 75   Temp 98.2 F (36.8 C) (Oral)   Resp 16   Ht 5\' 4"  (1.626 m)   Wt 90.7 kg   SpO2 98%   BMI 34.33 kg/m   8:01 PM Discussed with Dr. of neuro. Reccs initial work-up with MRI brain w/wo, CT head and neck.   11:35 PM Imaging reviewed by Dr. Iver Nestle. Reccs: LP w/ opening pressure, gram stain and bacterial culture, glucose, protein, if opening pressure > 20, drain to normal closing pressure or 50% less then opening pressure (whichever is greater). Please retain extra CSF in case additional studies are indicated after preliminary studies completed. If LP confirms IIH, will plan to start diamox and discharge, if CSF looks abnormal will need admission for further workup.   12:53 AM LP attempted by myself and Dr. Iver Nestle, but unsuccessful attempt. Pt comfortable, no immediate complications.   Neuro reccs admission for fluoro LP due to rapid progression of symptoms.   1:20 AM Discussed with patient -- she is adamant about leaving.  She does not want to stay.  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  She is willing to return to the emergency department as soon as possible in the morning to have this procedure done.  Of note, she does live in [place], [place] and has about an hour and 15-minute drive however her husband and her state that they can make this.  I encouraged the patient to reconsider, however she is quite adamant about leaving and is ready to go.  We discussed that her vision could worsen over this time, she could have permanent changes to her  vision.  She verbalizes that she understands this risk and still wants to leave.   MDM Rules/Calculators/A&P  Vision changes, HA, papilledema -- concern for IIH. Work-up incomplete -- patient not willing to stay for fluoro LP.   Final Clinical Impression(s) / ED Diagnoses Final diagnoses:  Vision changes  Papilledema    Rx / DC Orders ED Discharge Orders     None        Renne Crigler, PA-C 11/28/20 1513    Sabas Sous, MD 11/29/20 [phone]

## 2020-11-27 NOTE — Consult Note (Addendum)
Neurology Consultation Reason for Consult: Headache with vision changes Requesting Physician: Kennis Carina  CC: Vision dimming  History is obtained from: Patient and chart review   HPI: Rachael Love is a 60 y.o. female with a PMHx of obesity (BMI 34.33), hypertension, hyperlipidemia, mood disorder, chronic skin condition on terbinafine, B12 deficiency and vitamin D deficiency.  She reports that she was in her usual state of health but then on Sunday her vision changed.  She describes this as a feeling of the aftereffects of flash photography, without any clear change in visual acuity.  She did have an optometrist evaluation on Monday which was reassuring but notable for slightly elevated blood pressure and she was recommended to have follow-up with her primary care physician for that.  She also developed severe headache that woke her up out of sleep Monday morning at 4 AM, when she typically wakes up at 5 AM.  Headache is bifrontal and associated with some light sensitivity without sound sensitivity, some nausea and vomiting without fevers or chills, no neck stiffness, and worsened with eye movements laterally but denies pulsatile tinnitus or transient obscurations of her vision.  She has also noticed that the headache is worse when she is lying down and improves when her head is more elevated.  This is very different from her typical headaches which are rare and occur only 2-3 times a year which are difficult for her to characterize as they are so mild.  She denies any other focal neurological symptoms on extended questioning.  She denies any use of vitamin A parenteral containing compounds, oral contraceptives, tetracycline antibiotic (which she is allergic to).  Regarding new medications, she was started on lisinopril by urgent care for her hypertension, and she recently started Ocuvite 50+ (Lutein, zeaxanthin, omega-3), as well as montelukast for congestion.  Due to worsening sensation of dim vision  she was reevaluated by her optometrist on 9/22 who documented bilateral papilledema (left worse than right) with a small hemorrhage noted in the right superior peri-disc retina.  She was told to present to the ED for further evaluation, and neurology was asked to consult for recommendations.  Regarding her terbinafine, she reports that she uses this for cracked skin on her bilateral hands which has markedly improved with nearly daily use of this medication, prescribed by a dermatologist.  She reports she has not been given a formal diagnosis regarding the etiology of her skin changes.  ROS: All other review of systems was negative except as noted in the HPI.   History reviewed. No pertinent past medical history.  Other than listed above in HPI   No family history on file. Mother has macular degeneration and glaucoma  Current Outpatient Medications  Medication Instructions   buPROPion (WELLBUTRIN XL) 150 mg, Oral, Daily   dextroamphetamine (DEXTROSTAT) 10 mg, Oral, 2 times daily   folic acid (FOLVITE) 1 mg, Oral, Daily   ketoconazole (NIZORAL) 2 % cream 1 application, Topical, Daily PRN   lisinopril (ZESTRIL) 10 mg, Oral, Daily   montelukast (SINGULAIR) 10 mg, Oral, Daily at bedtime   QUEtiapine (SEROQUEL) 200 mg, Oral, Daily at bedtime   rosuvastatin (CRESTOR) 10 mg, Oral, Daily   terbinafine (LAMISIL) 250 mg, Oral, Daily PRN   triamcinolone ointment (KENALOG) 0.1 % 1 application, Topical, 2 times daily PRN   vitamin B-12 (CYANOCOBALAMIN) 1,000 mcg, Oral, Daily   Vitamin D (Ergocalciferol) (DRISDOL) 50,000 Units, Oral, Every Sun   Vitamin D3 5,000 Units, Oral, Daily with breakfast   Social  History:  has no history on file for tobacco use, alcohol use, and drug use.  Denies substance use   Exam: Current vital signs: BP 128/72 (BP Location: Left Arm)   Pulse 78   Temp 98.2 F (36.8 C) (Oral)   Resp 16   Ht 5\' 4"  (1.626 m)   Wt 90.7 kg   SpO2 98%   BMI 34.33 kg/m  Vital signs in  last 24 hours: Temp:  [98.2 F (36.8 C)] 98.2 F (36.8 C) (09/22 1627) Pulse Rate:  [75-78] 78 (09/22 2027) Resp:  [16] 16 (09/22 2027) BP: (128-130)/(69-72) 128/72 (09/22 2027) SpO2:  [98 %] 98 % (09/22 2027) Weight:  [90.7 kg] 90.7 kg (09/22 1627)   Physical Exam  Constitutional: Appears well-developed and well-nourished.  Obese Psych: Affect appropriate to situation, calm and cooperative Eyes: No scleral injection HENT: No oropharyngeal obstruction.  MSK: no joint deformities.  Cardiovascular: Normal rate and regular rhythm.  Respiratory: Effort normal, non-labored breathing GI: Soft.  No distension. There is no tenderness.  Skin: Warm dry and intact visible skin  Neuro: Mental Status: Patient is awake, alert, oriented to person, place, month, year, and situation. Patient is able to give a clear and coherent history. No signs of aphasia or neglect Cranial Nerves: II: Visual Fields are full. Pupils are equal, round, and reactive to light. Fundoscopic exam confirms imaging provided by patient -- small hemorrhage near the optic disc in the superior right eye with Grade I-II papilledema, on the left eye Grade II-III papilledema. No change in color perception, acuity not formally tested III,IV, VI: EOMI without ptosis or diploplia even in lateral gaze.  V: Facial sensation is symmetric to temperature and light touch VII: Facial movement is symmetric.  VIII: hearing is intact to voice and tuning fork bilaterally equally X: Uvula elevates symmetrically XI: Shoulder shrug is symmetric. XII: tongue is midline without atrophy or fasciculations.  Motor: Tone is normal. Bulk is normal. 5/5 strength was present in all four extremities.  Sensory: Sensation is symmetric to light touch and temperature in the arms and legs. Deep Tendon Reflexes: 3+ and symmetric in the biceps and patellae.  Plantars: Toes are mute bilaterally.  Cerebellar: FNF and HKS are intact bilaterally  NIHSS  total 0   I have reviewed labs in epic and the results pertinent to this consultation are:  Basic Metabolic Panel: Recent Labs  Lab 11/27/20 1753  NA 139  K 3.9  CL 105  CO2 27  GLUCOSE 122*  BUN 9  CREATININE 0.68  CALCIUM 9.0    CBC: Recent Labs  Lab 11/27/20 1753  WBC 11.0*  NEUTROABS 7.2  HGB 13.1  HCT 41.3  MCV 89.2  PLT 334    Coagulation Studies: No results for input(s): LABPROT, INR in the last 72 hours.    I have reviewed the images obtained:  Head CT personally reviewed, agree with radiology: 1. Partially empty sella and mildly tortuous optic nerves. These findings are nonspecific, but can be seen with idiopathic intracranial hypertension (which can cause headaches and papilledema). A lumbar puncture with opening pressure could further evaluate if clinically indicated. 2. Otherwise, no evidence of acute intracranial abnormality. 3. Air-fluid level in left sphenoid sinus with frothy secretions  CTA personally reviewed, agree with radiology:  1. Negative CTA for large vessel occlusion. No other acute vascular abnormality. 2. Mild atherosclerotic change about the carotid bifurcations and carotid siphons without hemodynamically significant or correctable stenosis. 3. Emphysema (ICD10-J43.9).  MRI brain and  orbits personally reviewed, agree with radiology: 1. Optic nerve tortuosity, prominence of the subarachnoid space around the optic nerves, flattening of the posterior sclera, and mild protrusion of the optic nerve heads, consistent with the patient's reported papilledema. In addition to narrowing of the bilateral transverse sinuses at the transverse sigmoid junctions and partially empty sella, these findings are concerning for idiopathic intracranial hypertension. Correlate with opening pressure. 2. No other orbital or intracranial abnormality. No definite abnormal optic nerve enhancement, although evaluation of the distal optic nerves is limited by motion  artifact.   Impression: Headache concerning for increased intracranial pressure.  Given her age and the rapid onset of her symptoms, recommended advanced imaging as above to rule out secondary etiologies.  I do feel that the imaging obtained is adequate to exclude significant venous thrombosis although dedicated venous imaging was not obtained.  Her obesity and gender do put her at risk for developing idiopathic intracranial hypertension, and the next step in evaluation should be lumbar puncture.  If this is consistent with IIH, patient may be started on Diamox with close outpatient neurology follow-up  On review of literature, terbinafine is not associated with IIH but does have "Decreased visual acuity, visual field defect" listed under postmarketing adverse drug reactions  Recommendations: - CTA, MRI brain and orbits completed on my recommendations as above - LP w/ opening pressure in lateral decubitus position, gram stain and bacterial culture, glucose, protein, if opening pressure > 20, drain to normal closing pressure or 50% less then opening pressure (whichever is greater). Please retain extra CSF in case additional studies are indicated after preliminary studies completed  - LP attempted in ED without success will need IR guided LP in the morning  - Neurology will continue to follow   Thank you for involving [place] in the care of this patient.  Brooke Dare MD-PhD [facility] [phone] Available 7 PM to 7 AM, outside of these hours please call Neurologist on call as listed on Amion.

## 2020-11-28 ENCOUNTER — Other Ambulatory Visit: Payer: Self-pay | Admitting: Neurology

## 2020-11-28 ENCOUNTER — Other Ambulatory Visit: Payer: Self-pay

## 2020-11-28 ENCOUNTER — Ambulatory Visit (HOSPITAL_COMMUNITY)
Admission: RE | Admit: 2020-11-28 | Discharge: 2020-11-28 | Disposition: A | Discharge status: Home / Self Care | Payer: Medicaid - Out of State | Source: Ambulatory Visit | Attending: Neurology | Admitting: Neurology

## 2020-11-28 ENCOUNTER — Other Ambulatory Visit (HOSPITAL_COMMUNITY): Payer: Self-pay

## 2020-11-28 DIAGNOSIS — R519 Headache, unspecified: Secondary | ICD-10-CM

## 2020-11-28 LAB — GLUCOSE, CSF: Glucose, CSF: 68 mg/dL (ref 40–70)

## 2020-11-28 LAB — CSF CELL COUNT WITH DIFFERENTIAL
RBC Count, CSF: 0 /mm3
Tube #: 3
WBC, CSF: 6 /mm3 — ABNORMAL HIGH (ref 0–5)

## 2020-11-28 LAB — PROTEIN, CSF: Total  Protein, CSF: 32 mg/dL (ref 15–45)

## 2020-11-28 IMAGING — RF DG SPINAL PUNCT LUMBAR DIAG WITH FL CT GUIDANCE
1 series · 1 of 1 positions shown · non-contrast
Comparison: None

CLINICAL DATA: Patient with papilledema.

EXAM:
DIAGNOSTIC LUMBAR PUNCTURE UNDER FLUOROSCOPIC GUIDANCE

[Series 1: cp_standard · 0.17mm/px · 1 of 1 slices shown]
[im 1/1]
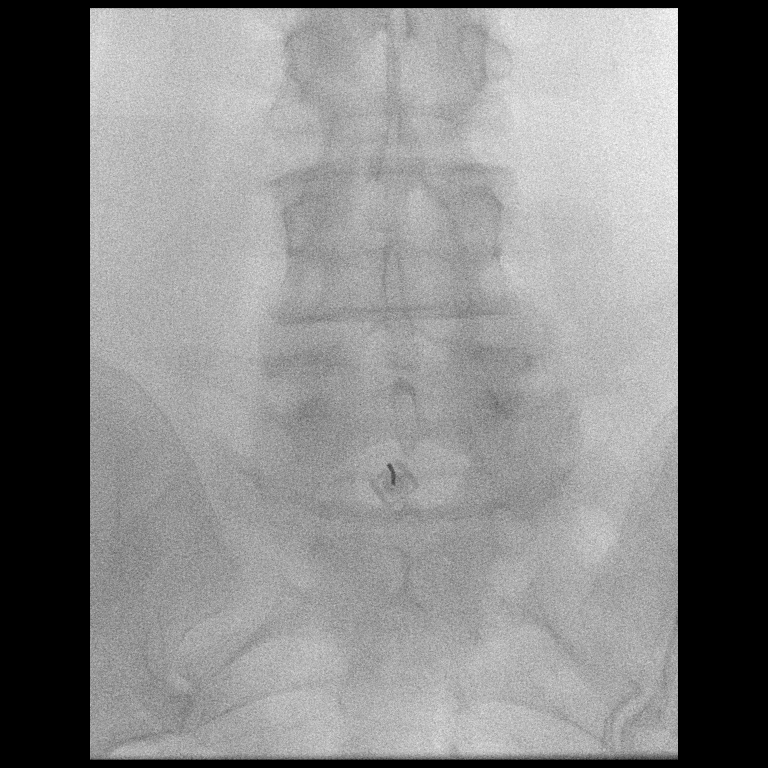

[1 of 1 positions shown; findings below may reference images not displayed]

FLUOROSCOPY TIME:  Fluoroscopy Time: 36 seconds

Radiation Exposure Index (if provided by the fluoroscopic device):
36

Number of Acquired Spot Images: 0

PROCEDURE:
Informed consent was obtained from the patient prior to the
procedure, including potential complications of headache, epidural
hemorrhage, infection, and pain.

Standard time-was employed. With the patient prone, the lower back
was prepped with Betadine. 1% Lidocaine was used for local
anesthesia. Lumbar puncture was performed at the L5-S1 level using a
20 gauge 5 inch needle with return of clear CSF with an opening
pressure of 29 cm water. 11 ml of CSF were obtained for laboratory
studies. The patient tolerated the procedure well and there were no
apparent complications.
IMPRESSION: Technically successful lumbar puncture. Opening pressure was 29 cm
of water.

## 2020-11-28 MED ORDER — LIDOCAINE HCL (PF) 1 % IJ SOLN
5.0000 mL | Freq: Once | INTRAMUSCULAR | Status: AC
  Administered 2020-11-28: 5 mL via INTRADERMAL

## 2020-11-28 NOTE — Discharge Instructions (Signed)
Myelogram and Lumbar Puncture Discharge Instructions  Go home and rest quietly for the next 24 hours.  It is important to lie flat for the next 24 hours.  Get up only to go to the restroom.  You may lie in the bed or on a couch on your back, your stomach, your left side or your right side.  You may have one pillow under your head.  You may have pillows between your knees while you are on your side or under your knees while you are on your back.  DO NOT drive today.  Recline the seat as far back as it will go, while still wearing your seat belt, on the way home.  You may get up to go to the bathroom as needed.  You may sit up for 10 minutes to eat.  You may resume your normal diet and medications unless otherwise indicated.  The incidence of headache, nausea, or vomiting is about 5% (one in 20 patients).  If you develop a headache, lie flat and drink plenty of fluids until the headache goes away.  Caffeinated beverages may be helpful.  If you develop severe nausea and vomiting or a headache that does not go away with flat bed rest, call [phone].  You may resume normal activities after your 24 hours of bed rest is over; however, do not exert yourself strongly or do any heavy lifting tomorrow.  Call your physician for a follow-up appointment.  The results of your myelogram will be sent directly to your physician by the following day.  Discharge instructions have been explained to the patient.  The patient, or the person responsible for the patient, fully understands these instructions.   

## 2020-12-01 LAB — CSF CULTURE W GRAM STAIN
Culture: NO GROWTH
Gram Stain: NONE SEEN

## 2020-12-02 ENCOUNTER — Telehealth: Payer: Self-pay | Admitting: Neurology

## 2020-12-02 NOTE — Telephone Encounter (Signed)
Discussed that my recommendation for this patient on 9/22 was admission for continued work-up with inpatient neurology following in consultation.  Unfortunately she had left the ED AGAINST MEDICAL ADVICE, returned and had a lumbar puncture completed but per Dr. Wilford Corner she declined being readmitted and wanted the procedure done only as an outpatient without hospital admission; it is outside the ability of the inpatient neurohospitalist team to follow-up procedures done on an outpatient basis. Expressed that I am glad she is currently at a hospital for emergent evaluation.  Brooke Dare, MD-PhD [facility] [phone]
